# Patient Record
Sex: Male | Born: 1935 | Race: White | Hispanic: No | Marital: Married | Smoking: Never smoker
Health system: Southern US, Community
[De-identification: ages and names within clinical notes are randomized; demographics above are authoritative.]

## PROBLEM LIST (undated history)

## (undated) DIAGNOSIS — M47812 Spondylosis without myelopathy or radiculopathy, cervical region: Secondary | ICD-10-CM

## (undated) DIAGNOSIS — Z860100 Personal history of colon polyps, unspecified: Secondary | ICD-10-CM

## (undated) DIAGNOSIS — I2510 Atherosclerotic heart disease of native coronary artery without angina pectoris: Secondary | ICD-10-CM

## (undated) DIAGNOSIS — N400 Enlarged prostate without lower urinary tract symptoms: Secondary | ICD-10-CM

## (undated) DIAGNOSIS — K219 Gastro-esophageal reflux disease without esophagitis: Secondary | ICD-10-CM

## (undated) HISTORY — DX: Personal history of colon polyps, unspecified: Z86.0100

## (undated) HISTORY — DX: Spondylosis without myelopathy or radiculopathy, cervical region: M47.812

## (undated) HISTORY — DX: Gastro-esophageal reflux disease without esophagitis: K21.9

## (undated) HISTORY — DX: Personal history of colonic polyps: Z86.010

## (undated) HISTORY — DX: Benign prostatic hyperplasia without lower urinary tract symptoms: N40.0

## (undated) HISTORY — PX: ROTATOR CUFF REPAIR: SHX139

---

## 2002-10-22 ENCOUNTER — Observation Stay (HOSPITAL_COMMUNITY): Admission: RE | Admit: 2002-10-22 | Discharge: 2002-10-23 | Payer: Self-pay | Admitting: Orthopedic Surgery

## 2002-10-22 ENCOUNTER — Encounter: Payer: Self-pay | Admitting: Orthopedic Surgery

## 2008-08-11 ENCOUNTER — Ambulatory Visit: Payer: Self-pay | Admitting: Gastroenterology

## 2009-05-01 ENCOUNTER — Encounter: Admission: RE | Admit: 2009-05-01 | Discharge: 2009-05-01 | Payer: Self-pay | Admitting: Geriatric Medicine

## 2010-09-28 NOTE — Op Note (Signed)
NAME:  William Campbell, William Campbell NO.:  [id number]   MEDICAL RECORD NO.:  [id number]                   PATIENT TYPE:  AMB   LOCATION:  DAY                                  FACILITY:  [facility]   PHYSICIAN:  Vania Rea. Supple, M.D.               DATE OF BIRTH:  10-12-1929   DATE OF PROCEDURE:  10/22/2002  DATE OF DISCHARGE:                                 OPERATIVE REPORT   PREOPERATIVE DIAGNOSES:  1. Massive retracted right shoulder rotator cuff tear.  2. Right shoulder AC joint arthroses.   POSTOPERATIVE DIAGNOSES:  1. Massive retracted right shoulder rotator cuff tear.  2. Right shoulder AC joint arthroses.   PROCEDURE:  1. Open right shoulder rotator cuff repair.  2. Distal clavicle resection.  3. Restore patch augmentation of rotator cuff repair.   SURGEON:  Vania Rea. Supple, M.D.   Threasa HeadsFrench Ana A. Shuford, P.A.-C.   ANESTHESIA:  General endotracheal as well as Interscalene block.   ESTIMATED BLOOD LOSS:  Less than 100 cc.   HISTORY:  William Campbell is a 77 year old gentleman who fell, injuring his right  shoulder several months ago.  He has had persistent pain, weakness and  limitations in motion.  On physical examination showing marked weakness in  rotator cuff.  Preoperative MRI scan confirms severe retraction of a large  rotator cuff tear, with some evidence for fatty infiltration of the muscle  belly.   He is brought to the operating room for planned attempt at rotator cuff  repair.  Preoperatively the patient was counseled on treatment options, as  well as risks versus benefits thereof.  Possible surgical complications  include bleeding, infection, neurovascular injury, persistent pain, loss of  motion, recurrence of rotator cuff tear, and possible need for additional  surgery; these were all reviewed.  He understands and accepts and agrees  with our planned procedure.   PROCEDURE IN DETAIL:  After undergoing routine preoperative evaluation,  the  patient received prophylactic antibiotics.  Interscalene block was placed in  the holding area by the anesthesiologist.  He was placed supine on the  operating room table and underwent the smooth induction of general  endotracheal anesthesia.  He was placed in the beach chair position and  appropriately padded and protected.  The right shoulder girdle was sterilely  prepped and draped in standard fashion.   An incision was outlined, beginning at the [facility] joint and extending laterally  and distally for a length of approximately 7 cm.  Skin incisions were  infiltrated with 0.5% Marcaine with epinephrine.  The tendon was sharply  divided, as was the subcutaneous tissues.  The interval between the anterior  and mid third of the deltoid was then identified and split longitudinally  along the course of the deltoid fibers.  Electrocautery was used for  hemostasis.  Subperiosteal elevation was then used to expose the distal  clavicle and the  anterior acromion.  Oscillating saw was introduced and used  to perform distal clavicle resection.  The oscillating saw was then used to  perform a subacromial decompression, performing anterior and inferior  acromionectomy and creating a type 1 morphology.  Blunted bursal tissue was  excised circumferentially from the entire subacromial/subdeltoid bursa.  There was evidence for a very large retracted tear of the rotator cuff, with  the large portion of the rotator cuff having subluxed posteriorly.  The  humeral head was directly abutting the inner surface of the acromion.   The biceps tendon was inspected and was found to be intact; although there  was some evidence for tenosynovitis.  We mobilized the rotator cuff and  found that the posterior segment was actually of relatively good tissue  quality, and the muscle belly themselves had good elasticity.  A freed  margin of this posterior flap was then grasped with a #2 fiber wire suture.  It was then  mobilized in superior and inferior aspects, and we were able to  advance this to perform a side-to-side repair against the upper portion of  the subscapularis.  This was closed with figure-of-eight #2 fiber wire  sutures.   We freshened the freed margin of the rotator cuff with the #15 blade.  An  osteotome was then used to create a bony cancellous trough on the apex of  the greater tuberosity, and the width of the care site at the greater  tuberosity ended up being approximately 3 cm in width.  A series of grasping  sutures were then placed into the freed margin of the rotator cuff, adjacent  to the greater tuberosity.  Bone tunnels were then made, and sutures were  then passed; these were then tied over bony bridges, bringing the rotator  cuff margin down into good apposition against the cancellous bed on the  greater tuberosity.   At this point there was still some exposed area of greater tuberosity bony  bed, and to augment the repair we had decided to utilize a Restore patch.  This was appropriately hydrated and then placed to cover the entire repair  site, including the side-to-side repair as well as the repair back to the  greater tuberosity.  The Restore patch was placed with 2-0 Vicryl, and good  tension was achieved.   Final irrigation was performed.  Hemostasis was obtained.  The deltoid split  was then closed with interrupted figure-of-eight #1 Vicryl sutures.  Then 3-  0 Vicryl was used for the subcutaneous layer, and 3-0 Monocryl used for the  skin and followed by Steri-Strips.  Sterile dressing was placed over the  right shoulder.  The right arm was placed in a shoulder immobilizer.  The  patient was then extubated, placed supine on the exiting table, sent to  recovery room in stable condition.                                               Vania Rea. Supple, M.D.   KMS/MEDQ  D:  10/22/2002  T:  10/22/2002  Job:  [id number]

## 2011-04-26 ENCOUNTER — Other Ambulatory Visit: Payer: Self-pay | Admitting: Geriatric Medicine

## 2011-08-01 DIAGNOSIS — H2510 Age-related nuclear cataract, unspecified eye: Secondary | ICD-10-CM | POA: Diagnosis not present

## 2011-08-02 DIAGNOSIS — H2510 Age-related nuclear cataract, unspecified eye: Secondary | ICD-10-CM | POA: Diagnosis not present

## 2012-02-20 DIAGNOSIS — Z23 Encounter for immunization: Secondary | ICD-10-CM | POA: Diagnosis not present

## 2012-04-29 DIAGNOSIS — Z0000 Encounter for general adult medical examination without abnormal findings: Secondary | ICD-10-CM | POA: Diagnosis not present

## 2012-04-29 DIAGNOSIS — Z1331 Encounter for screening for depression: Secondary | ICD-10-CM | POA: Diagnosis not present

## 2012-04-29 DIAGNOSIS — Z136 Encounter for screening for cardiovascular disorders: Secondary | ICD-10-CM | POA: Diagnosis not present

## 2012-04-29 DIAGNOSIS — Z79899 Other long term (current) drug therapy: Secondary | ICD-10-CM | POA: Diagnosis not present

## 2012-08-13 DIAGNOSIS — H2510 Age-related nuclear cataract, unspecified eye: Secondary | ICD-10-CM | POA: Diagnosis not present

## 2012-08-13 DIAGNOSIS — R5381 Other malaise: Secondary | ICD-10-CM | POA: Diagnosis not present

## 2012-08-13 DIAGNOSIS — R5383 Other fatigue: Secondary | ICD-10-CM | POA: Diagnosis not present

## 2012-08-13 DIAGNOSIS — R05 Cough: Secondary | ICD-10-CM | POA: Diagnosis not present

## 2012-08-13 DIAGNOSIS — R634 Abnormal weight loss: Secondary | ICD-10-CM | POA: Diagnosis not present

## 2012-09-10 DIAGNOSIS — R05 Cough: Secondary | ICD-10-CM | POA: Diagnosis not present

## 2012-09-10 DIAGNOSIS — R634 Abnormal weight loss: Secondary | ICD-10-CM | POA: Diagnosis not present

## 2013-02-22 DIAGNOSIS — Z23 Encounter for immunization: Secondary | ICD-10-CM | POA: Diagnosis not present

## 2013-05-03 DIAGNOSIS — K219 Gastro-esophageal reflux disease without esophagitis: Secondary | ICD-10-CM | POA: Diagnosis not present

## 2013-05-03 DIAGNOSIS — R7309 Other abnormal glucose: Secondary | ICD-10-CM | POA: Diagnosis not present

## 2013-05-03 DIAGNOSIS — Z79899 Other long term (current) drug therapy: Secondary | ICD-10-CM | POA: Diagnosis not present

## 2013-05-03 DIAGNOSIS — Z0000 Encounter for general adult medical examination without abnormal findings: Secondary | ICD-10-CM | POA: Diagnosis not present

## 2013-05-03 DIAGNOSIS — E559 Vitamin D deficiency, unspecified: Secondary | ICD-10-CM | POA: Diagnosis not present

## 2013-05-03 DIAGNOSIS — Z1331 Encounter for screening for depression: Secondary | ICD-10-CM | POA: Diagnosis not present

## 2013-05-03 DIAGNOSIS — E7800 Pure hypercholesterolemia, unspecified: Secondary | ICD-10-CM | POA: Diagnosis not present

## 2013-08-02 DIAGNOSIS — E559 Vitamin D deficiency, unspecified: Secondary | ICD-10-CM | POA: Diagnosis not present

## 2013-09-21 DIAGNOSIS — H2510 Age-related nuclear cataract, unspecified eye: Secondary | ICD-10-CM | POA: Diagnosis not present

## 2013-10-01 DIAGNOSIS — Z961 Presence of intraocular lens: Secondary | ICD-10-CM | POA: Diagnosis not present

## 2013-10-01 DIAGNOSIS — H2510 Age-related nuclear cataract, unspecified eye: Secondary | ICD-10-CM | POA: Diagnosis not present

## 2013-10-01 DIAGNOSIS — H02839 Dermatochalasis of unspecified eye, unspecified eyelid: Secondary | ICD-10-CM | POA: Diagnosis not present

## 2013-10-01 DIAGNOSIS — H18419 Arcus senilis, unspecified eye: Secondary | ICD-10-CM | POA: Diagnosis not present

## 2013-11-29 DIAGNOSIS — H2510 Age-related nuclear cataract, unspecified eye: Secondary | ICD-10-CM | POA: Diagnosis not present

## 2013-11-29 DIAGNOSIS — H269 Unspecified cataract: Secondary | ICD-10-CM | POA: Diagnosis not present

## 2014-02-07 DIAGNOSIS — Z23 Encounter for immunization: Secondary | ICD-10-CM | POA: Diagnosis not present

## 2014-02-22 ENCOUNTER — Other Ambulatory Visit: Payer: Self-pay | Admitting: Geriatric Medicine

## 2014-02-22 ENCOUNTER — Ambulatory Visit
Admission: RE | Admit: 2014-02-22 | Discharge: 2014-02-22 | Disposition: A | Discharge status: Home / Self Care | Payer: Medicare Other | Source: Ambulatory Visit | Attending: Geriatric Medicine | Admitting: Geriatric Medicine

## 2014-02-22 DIAGNOSIS — R0609 Other forms of dyspnea: Secondary | ICD-10-CM | POA: Diagnosis not present

## 2014-02-22 DIAGNOSIS — R05 Cough: Secondary | ICD-10-CM

## 2014-02-22 DIAGNOSIS — R0989 Other specified symptoms and signs involving the circulatory and respiratory systems: Secondary | ICD-10-CM | POA: Diagnosis not present

## 2014-02-22 DIAGNOSIS — R059 Cough, unspecified: Secondary | ICD-10-CM

## 2014-02-22 DIAGNOSIS — R5383 Other fatigue: Secondary | ICD-10-CM | POA: Diagnosis not present

## 2014-03-02 ENCOUNTER — Encounter: Payer: Self-pay | Admitting: *Deleted

## 2014-03-08 ENCOUNTER — Ambulatory Visit: Payer: Medicare Other | Admitting: Cardiovascular Disease

## 2014-03-11 ENCOUNTER — Ambulatory Visit (INDEPENDENT_AMBULATORY_CARE_PROVIDER_SITE_OTHER): Payer: Medicare Other | Admitting: Cardiovascular Disease

## 2014-03-11 ENCOUNTER — Encounter: Payer: Self-pay | Admitting: Cardiovascular Disease

## 2014-03-11 VITALS — BP 165/100 | HR 67 | Ht 68.5 in | Wt 192.2 lb

## 2014-03-11 DIAGNOSIS — M5489 Other dorsalgia: Secondary | ICD-10-CM

## 2014-03-11 DIAGNOSIS — R5383 Other fatigue: Secondary | ICD-10-CM

## 2014-03-11 DIAGNOSIS — R030 Elevated blood-pressure reading, without diagnosis of hypertension: Secondary | ICD-10-CM | POA: Diagnosis not present

## 2014-03-11 DIAGNOSIS — R0602 Shortness of breath: Secondary | ICD-10-CM | POA: Diagnosis not present

## 2014-03-11 DIAGNOSIS — IMO0001 Reserved for inherently not codable concepts without codable children: Secondary | ICD-10-CM | POA: Insufficient documentation

## 2014-03-11 DIAGNOSIS — M549 Dorsalgia, unspecified: Secondary | ICD-10-CM | POA: Insufficient documentation

## 2014-03-11 NOTE — Assessment & Plan Note (Signed)
The patient has significant exertional dyspnea of unclear etiology. There is no evidence of fluid overload by physical exam. He had recent labs done which overall were unremarkable including normal CBC, basic metabolic profile and thyroid function. Chest x-ray showed no active cardiopulmonary disease. I agree that we have to rule out angina equivalent considering his age. Thus, I requested a pharmacologic nuclear stress test. Given that the predominant symptom is dyspnea, I also requested an echocardiogram.

## 2014-03-11 NOTE — Progress Notes (Signed)
Primary care physician: Dr. Pete GlatterStoneking  HPI  This is a pleasant 80 year old man who was referred for evaluation of fatigue and exertional dyspnea. He has no previous cardiac history. He has known history of borderline hyperlipidemia but otherwise no significant chronic medical conditions. He has been complaining of extreme exertional dyspnea and fatigue which has been getting worse over the last 6 months. He denies any chest discomfort. He has been active throughout his life. No orthopnea, PND or lower extremity edema. No palpitations, syncope or presyncope. He does not smoke. He drinks alcohol occasionally. He has no family history of coronary artery disease. He complains of chronic back and hip pain.  No Known Allergies   Current Outpatient Prescriptions on File Prior to Visit  Medication Sig Dispense Refill  . Cholecalciferol (VITAMIN D3) 2000 UNITS capsule Take 2,000 Units by mouth daily.      . Cyanocobalamin (VITAMIN B-12) 50 MCG LOZG Take by mouth 2 (two) times a week.      Marland Kitchen. omeprazole (PRILOSEC) 20 MG capsule Take 20 mg by mouth as needed.      . vitamin C (ASCORBIC ACID) 500 MG tablet Take 500 mg by mouth daily.       No current facility-administered medications on file prior to visit.     Past Medical History  Diagnosis Date  . GERD (gastroesophageal reflux disease)   . Osteoarthritis of neck   . BPH (benign prostatic hypertrophy)   . History of colonic polyps      Past Surgical History  Procedure Laterality Date  . Rotator cuff repair Right      Family History  Problem Relation Age of Onset  . Family history unknown: Yes     History   Social History  . Marital Status: Married    Spouse Name: N/A    Number of Children: N/A  . Years of Education: N/A   Occupational History  . Not on file.   Social History Main Topics  . Smoking status: Never Smoker   . Smokeless tobacco: Not on file  . Alcohol Use: Yes  . Drug Use: No  . Sexual Activity: Not on file    Other Topics Concern  . Not on file   Social History Narrative  . No narrative on file     ROS A 10 point review of system was performed. It is negative other than that mentioned in the history of present illness.   PHYSICAL EXAM   BP 165/100  Pulse 67  Ht 5' 8.5" (1.74 m)  Wt 192 lb 4 oz (87.204 kg)  BMI 28.80 kg/m2 Constitutional: He is oriented to person, place, and time. He appears well-developed and well-nourished. No distress.  HENT: No nasal discharge.  Head: Normocephalic and atraumatic.  Eyes: Pupils are equal and round.  No discharge. Neck: Normal range of motion. Neck supple. No JVD present. No thyromegaly present.  Cardiovascular: Normal rate, regular rhythm, normal heart sounds. Exam reveals no gallop and no friction rub. No murmur heard.  Pulmonary/Chest: Effort normal and breath sounds normal. No stridor. No respiratory distress. He has no wheezes. He has no rales. He exhibits no tenderness.  Abdominal: Soft. Bowel sounds are normal. He exhibits no distension. There is no tenderness. There is no rebound and no guarding.  Musculoskeletal: Normal range of motion. He exhibits no edema and no tenderness.  Neurological: He is alert and oriented to person, place, and time. Coordination normal.  Skin: Skin is warm and dry. No rash noted.  He is not diaphoretic. No erythema. No pallor.  Psychiatric: He has a normal mood and affect. His behavior is normal. Judgment and thought content normal.       HYQ:MVHQIEKG:Sinus  Rhythm  - occasional PAC    # PACs = 1. WITHIN NORMAL LIMITS   ASSESSMENT AND PLAN

## 2014-03-11 NOTE — Assessment & Plan Note (Signed)
Blood pressure is elevated today. However, this seems to be an unusual for him. Continue to monitor.

## 2014-03-11 NOTE — Assessment & Plan Note (Signed)
Femoral pulses are normal. Thus, this is likely not related to PAD.

## 2014-03-11 NOTE — Patient Instructions (Addendum)
[facility] MYOVIEW  Your caregiver has ordered a Stress Test with nuclear imaging. The purpose of this test is to evaluate the blood supply to your heart muscle. This procedure is referred to as a "Non-Invasive Stress Test." This is because other than having an IV started in your vein, nothing is inserted or "invades" your body. Cardiac stress tests are done to find areas of poor blood flow to the heart by determining the extent of coronary artery disease (CAD). Some patients exercise on a treadmill, which naturally increases the blood flow to your heart, while others who are  unable to walk on a treadmill due to physical limitations have a pharmacologic/chemical stress agent called Lexiscan . This medicine will mimic walking on a treadmill by temporarily increasing your coronary blood flow.   Please note: these test may take anywhere between 2-4 hours to complete  PLEASE REPORT TO [facility]  THE VOLUNTEERS AT THE FIRST DESK WILL DIRECT YOU WHERE TO GO  Date of Procedure:_________11/03/15____________________________  Arrival Time for Procedure:______0715 am________________________   PLEASE NOTIFY THE OFFICE AT LEAST 24 HOURS IN ADVANCE IF YOU ARE UNABLE TO KEEP YOUR APPOINTMENT.  [phone] AND  PLEASE NOTIFY NUC[facility]AR MEDICINE AT [facility] AT LEAST 24 HOURS IN ADVANCE IF YOU ARE UNABLE TO KEEP YOUR APPOINTMENT. [phone]  How to prepare for your Myoview test:  1. Do not eat or drink after midnight 2. No caffeine for 24 hours prior to test 3. No smoking 24 hours prior to test. 4. Your medication may be taken with water.  If your doctor stopped a medication because of this test, do not take that medication. 5. Ladies, please do not wear dresses.  Skirts or pants are appropriate. Please wear a short sleeve shirt. 6. No perfume, cologne or lotion. 7. Wear comfortable walking shoes. No heels!       Your physician has requested that you have an echocardiogram. Echocardiography is a  painless test that uses sound waves to create images of your heart. It provides your doctor with information about the size and shape of your heart and how well your heart's chambers and valves are working. This procedure takes approximately one hour. There are no restrictions for this procedure.  Your physician recommends that you schedule a follow-up appointment in:  As needed   Your next appointment will be scheduled in our new office located at :  [facility]  [address], [address]  [place], [place] [id number]

## 2014-03-13 HISTORY — PX: CARDIAC CATHETERIZATION: SHX172

## 2014-03-15 ENCOUNTER — Ambulatory Visit: Payer: Self-pay | Admitting: Cardiovascular Disease

## 2014-03-15 DIAGNOSIS — R0602 Shortness of breath: Secondary | ICD-10-CM | POA: Diagnosis not present

## 2014-03-16 ENCOUNTER — Other Ambulatory Visit: Payer: Self-pay | Admitting: *Deleted

## 2014-03-16 DIAGNOSIS — R0602 Shortness of breath: Secondary | ICD-10-CM

## 2014-03-18 ENCOUNTER — Telehealth: Payer: Self-pay | Admitting: *Deleted

## 2014-03-18 DIAGNOSIS — I5022 Chronic systolic (congestive) heart failure: Secondary | ICD-10-CM

## 2014-03-18 NOTE — Telephone Encounter (Signed)
Patient would like to speak with Dr. Kirke CorinArida before scheduling cath

## 2014-03-18 NOTE — Telephone Encounter (Signed)
I accidentally closed encounter  Please call patient

## 2014-03-18 NOTE — Telephone Encounter (Signed)
I don't know why the results did not come to my in basket.  The stress test was abnormal and showed possible blockage. I suggest proceeding with cardiac cath next week. I can do on Thursday.

## 2014-03-18 NOTE — Telephone Encounter (Signed)
Patient called wanting stress test results. 

## 2014-03-18 NOTE — Telephone Encounter (Signed)
Call patient Monday 03/21/14 to schedule cath per Dr. Kirke CorinArida

## 2014-03-21 DIAGNOSIS — Z23 Encounter for immunization: Secondary | ICD-10-CM | POA: Diagnosis not present

## 2014-03-21 MED ORDER — LOSARTAN POTASSIUM 25 MG PO TABS: 25.0000 mg | ORAL_TABLET | Freq: Every day | ORAL | Status: DC

## 2014-03-21 MED ORDER — CARVEDILOL 6.25 MG PO TABS: 6.2500 mg | ORAL_TABLET | Freq: Two times a day (BID) | ORAL | Status: DC

## 2014-03-21 NOTE — Telephone Encounter (Signed)
Informed Huntley [facility]Sara at [facility] that Coreg and Losartan were ordered in error  She verbalized understanding

## 2014-03-22 ENCOUNTER — Encounter: Payer: Self-pay | Admitting: Cardiovascular Disease

## 2014-03-22 ENCOUNTER — Ambulatory Visit (INDEPENDENT_AMBULATORY_CARE_PROVIDER_SITE_OTHER): Payer: Medicare Other | Admitting: Cardiovascular Disease

## 2014-03-22 VITALS — BP 148/98 | HR 67 | Ht 70.0 in | Wt 193.2 lb

## 2014-03-22 DIAGNOSIS — R9439 Abnormal result of other cardiovascular function study: Secondary | ICD-10-CM | POA: Diagnosis not present

## 2014-03-22 DIAGNOSIS — R030 Elevated blood-pressure reading, without diagnosis of hypertension: Secondary | ICD-10-CM | POA: Diagnosis not present

## 2014-03-22 DIAGNOSIS — Z01812 Encounter for preprocedural laboratory examination: Secondary | ICD-10-CM | POA: Diagnosis not present

## 2014-03-22 DIAGNOSIS — IMO0001 Reserved for inherently not codable concepts without codable children: Secondary | ICD-10-CM

## 2014-03-22 DIAGNOSIS — R0602 Shortness of breath: Secondary | ICD-10-CM

## 2014-03-22 MED ORDER — ASPIRIN 81 MG PO TABS: 81.0000 mg | ORAL_TABLET | Freq: Every day | ORAL | Status: DC

## 2014-03-22 NOTE — Telephone Encounter (Signed)
LVM at [facility] to schedule cath

## 2014-03-22 NOTE — Assessment & Plan Note (Signed)
His symptoms are likely angina equivalent. Symptoms are suggestive of class III angina. Nuclear stress test was abnormal at moderate risk. I discussed different management options and recommend proceeding with cardiac catheterization and possible coronary intervention. Risks and benefits were discussed with him extensively.

## 2014-03-22 NOTE — Telephone Encounter (Signed)
Patient in clinic today to review cath info

## 2014-03-22 NOTE — Assessment & Plan Note (Signed)
I would consider treatment with a beta blocker based on the results of cardiac catheterization.

## 2014-03-22 NOTE — Progress Notes (Signed)
Primary care physician: Dr. Pete GlatterStoneking  HPI  This is a pleasant 80 year old man who is here today for follow-up visit regarding fatigue and exertional dyspnea. He has no previous cardiac history. He has known history of borderline hyperlipidemia but otherwise no significant chronic medical conditions. He was seen recently for extreme exertional dyspnea and fatigue which has been getting worse over the last 6 months. He denies any chest discomfort. He has been active throughout his life. No orthopnea, PND or lower extremity edema. No palpitations, syncope or presyncope. He does not smoke. He drinks alcohol occasionally. He has no family history of coronary artery disease. He complains of chronic back and hip pain. He underwent a pharmacologic nuclear stress test which showed evidence of anterior and anterolateral ischemia with normal ejection fraction. Overall it was a moderate risk stress test.  No Known Allergies   Current Outpatient Prescriptions on File Prior to Visit  Medication Sig Dispense Refill  . Cholecalciferol (VITAMIN D3) 2000 UNITS capsule Take 2,000 Units by mouth daily.    . Cyanocobalamin (VITAMIN B-12) 50 MCG LOZG Take by mouth 2 (two) times a week.    Marland Kitchen. omeprazole (PRILOSEC) 20 MG capsule Take 20 mg by mouth as needed.    . vitamin C (ASCORBIC ACID) 500 MG tablet Take 500 mg by mouth daily.     No current facility-administered medications on file prior to visit.     Past Medical History  Diagnosis Date  . GERD (gastroesophageal reflux disease)   . Osteoarthritis of neck   . BPH (benign prostatic hypertrophy)   . History of colonic polyps      Past Surgical History  Procedure Laterality Date  . Rotator cuff repair Right      Family History  Problem Relation Age of Onset  . Family history unknown: Yes     History   Social History  . Marital Status: Married    Spouse Name: N/A    Number of Children: N/A  . Years of Education: N/A   Occupational  History  . Not on file.   Social History Main Topics  . Smoking status: Never Smoker   . Smokeless tobacco: Not on file  . Alcohol Use: Yes  . Drug Use: No  . Sexual Activity: Not on file   Other Topics Concern  . Not on file   Social History Narrative     ROS A 10 point review of system was performed. It is negative other than that mentioned in the history of present illness.   PHYSICAL EXAM   BP 148/98 mmHg  Pulse 67  Ht 5\' 10"  (1.778 m)  Wt 193 lb 4 oz (87.658 kg)  BMI 27.73 kg/m2 Constitutional: He is oriented to person, place, and time. He appears well-developed and well-nourished. No distress.  HENT: No nasal discharge.  Head: Normocephalic and atraumatic.  Eyes: Pupils are equal and round.  No discharge. Neck: Normal range of motion. Neck supple. No JVD present. No thyromegaly present.  Cardiovascular: Normal rate, regular rhythm, normal heart sounds. Exam reveals no gallop and no friction rub. No murmur heard.  Pulmonary/Chest: Effort normal and breath sounds normal. No stridor. No respiratory distress. He has no wheezes. He has no rales. He exhibits no tenderness.  Abdominal: Soft. Bowel sounds are normal. He exhibits no distension. There is no tenderness. There is no rebound and no guarding.  Musculoskeletal: Normal range of motion. He exhibits no edema and no tenderness.  Neurological: He is alert and oriented to  person, place, and time. Coordination normal.  Skin: Skin is warm and dry. No rash noted. He is not diaphoretic. No erythema. No pallor.  Psychiatric: He has a normal mood and affect. His behavior is normal. Judgment and thought content normal.       ASSESSMENT AND PLAN

## 2014-03-22 NOTE — Patient Instructions (Addendum)
[facility] Cardiac Cath Instructions   You are scheduled for a Cardiac Cath on:____11/13/15_____________________  Please arrive at __0630 am_____am on the day of your procedure  You will need to pre-register prior to the day of your procedure.  [facility].  Registration is the first desk on your right.  Please take the procedure order we have given you in order to be registered appropriately  Do not eat/drink anything after midnight  Someone will need to drive you home  It is recommended someone be with you for the first 24 hours after your procedure  Wear clothes that are easy to get on/off and wear slip on shoes if possible   Medications bring a current list of all medications with you  _x__ You may take all of your medications the morning of your procedure with enough water to swallow safely   Day of your procedure: Arrive at the Medical Mall entrance.  Free valet service is available.  After entering the Medical Mall please check-in at the registration desk (1st desk on your right) to receive your armband. After receiving your armband someone will escort you to the cardiac cath/special procedures waiting area.  The usual length of stay after your procedure is about 2 to 3 hours.  This can vary.  If you have any questions, please call our office at [phone], or you may call the cardiac cath lab at [facility] directly at [phone]  Your physician has recommended you make the following change in your medication:  Start Aspirin 81 mg once daily

## 2014-03-23 ENCOUNTER — Telehealth: Payer: Self-pay | Admitting: *Deleted

## 2014-03-23 LAB — CBC WITH DIFFERENTIAL
Basophils Absolute: 0 10*3/uL (ref 0.0–0.2)
Basos: 1 %
Eos: 5 %
Eosinophils Absolute: 0.3 10*3/uL (ref 0.0–0.4)
HCT: 42.4 % (ref 37.5–51.0)
Hemoglobin: 14.7 g/dL (ref 12.6–17.7)
IMMATURE GRANS (ABS): 0 10*3/uL (ref 0.0–0.1)
IMMATURE GRANULOCYTES: 0 %
Lymphocytes Absolute: 1.9 10*3/uL (ref 0.7–3.1)
Lymphs: 31 %
MCH: 31.8 pg (ref 26.6–33.0)
MCHC: 34.7 g/dL (ref 31.5–35.7)
MCV: 92 fL (ref 79–97)
MONOCYTES: 12 %
Monocytes Absolute: 0.7 10*3/uL (ref 0.1–0.9)
NEUTROS PCT: 51 %
Neutrophils Absolute: 3.1 10*3/uL (ref 1.4–7.0)
PLATELETS: 260 10*3/uL (ref 150–379)
RBC: 4.62 x10E6/uL (ref 4.14–5.80)
RDW: 13.5 % (ref 12.3–15.4)
WBC: 6.1 10*3/uL (ref 3.4–10.8)

## 2014-03-23 LAB — BASIC METABOLIC PANEL
BUN/Creatinine Ratio: 15 (ref 10–22)
BUN: 16 mg/dL (ref 8–27)
CHLORIDE: 98 mmol/L (ref 97–108)
CO2: 21 mmol/L (ref 18–29)
Calcium: 10.2 mg/dL (ref 8.6–10.2)
Creatinine, Ser: 1.1 mg/dL (ref 0.76–1.27)
GFR calc Af Amer: 72 mL/min/{1.73_m2} (ref >59)
GFR calc non Af Amer: 63 mL/min/{1.73_m2} (ref >59)
GLUCOSE: 125 mg/dL — AB (ref 65–99)
Potassium: 4.6 mmol/L (ref 3.5–5.2)
Sodium: 135 mmol/L (ref 134–144)

## 2014-03-23 LAB — PROTIME-INR
INR: 1 (ref 0.8–1.2)
PROTHROMBIN TIME: 10.6 s (ref 9.1–12.0)

## 2014-03-23 NOTE — Telephone Encounter (Signed)
Faxed cath orders confirmed by sharon

## 2014-03-25 ENCOUNTER — Ambulatory Visit: Payer: Self-pay | Admitting: Cardiovascular Disease

## 2014-03-25 DIAGNOSIS — I25118 Atherosclerotic heart disease of native coronary artery with other forms of angina pectoris: Secondary | ICD-10-CM

## 2014-03-25 DIAGNOSIS — Z79899 Other long term (current) drug therapy: Secondary | ICD-10-CM | POA: Diagnosis not present

## 2014-03-25 DIAGNOSIS — Z9889 Other specified postprocedural states: Secondary | ICD-10-CM | POA: Diagnosis not present

## 2014-03-25 DIAGNOSIS — I2510 Atherosclerotic heart disease of native coronary artery without angina pectoris: Secondary | ICD-10-CM | POA: Diagnosis not present

## 2014-03-25 DIAGNOSIS — I259 Chronic ischemic heart disease, unspecified: Secondary | ICD-10-CM | POA: Diagnosis not present

## 2014-03-25 DIAGNOSIS — K219 Gastro-esophageal reflux disease without esophagitis: Secondary | ICD-10-CM | POA: Diagnosis not present

## 2014-03-25 DIAGNOSIS — Z86010 Personal history of colonic polyps: Secondary | ICD-10-CM | POA: Diagnosis not present

## 2014-03-25 DIAGNOSIS — M1990 Unspecified osteoarthritis, unspecified site: Secondary | ICD-10-CM | POA: Diagnosis not present

## 2014-03-25 DIAGNOSIS — I10 Essential (primary) hypertension: Secondary | ICD-10-CM | POA: Diagnosis not present

## 2014-03-25 DIAGNOSIS — N400 Enlarged prostate without lower urinary tract symptoms: Secondary | ICD-10-CM | POA: Diagnosis not present

## 2014-03-29 DIAGNOSIS — R0609 Other forms of dyspnea: Secondary | ICD-10-CM | POA: Diagnosis not present

## 2014-03-31 ENCOUNTER — Ambulatory Visit: Payer: Self-pay | Admitting: Geriatric Medicine

## 2014-03-31 ENCOUNTER — Other Ambulatory Visit: Payer: Medicare Other

## 2014-03-31 DIAGNOSIS — R0600 Dyspnea, unspecified: Secondary | ICD-10-CM | POA: Diagnosis not present

## 2014-04-12 ENCOUNTER — Ambulatory Visit: Payer: Medicare Other | Admitting: Cardiovascular Disease

## 2014-04-14 ENCOUNTER — Encounter: Payer: Self-pay | Admitting: *Deleted

## 2014-04-15 ENCOUNTER — Ambulatory Visit (INDEPENDENT_AMBULATORY_CARE_PROVIDER_SITE_OTHER): Payer: Medicare Other | Admitting: Cardiovascular Disease

## 2014-04-15 ENCOUNTER — Encounter: Payer: Self-pay | Admitting: Cardiovascular Disease

## 2014-04-15 VITALS — BP 140/90 | HR 66

## 2014-04-15 DIAGNOSIS — R030 Elevated blood-pressure reading, without diagnosis of hypertension: Secondary | ICD-10-CM | POA: Diagnosis not present

## 2014-04-15 DIAGNOSIS — R0602 Shortness of breath: Secondary | ICD-10-CM

## 2014-04-15 DIAGNOSIS — IMO0001 Reserved for inherently not codable concepts without codable children: Secondary | ICD-10-CM

## 2014-04-15 NOTE — Assessment & Plan Note (Signed)
This does not seem to be cardiac based on results of cardiac cath. There is likely an element of physical deconditioning.  I advised him to follow up with PCP for further evaluation.

## 2014-04-15 NOTE — Assessment & Plan Note (Signed)
BP is borderline today.

## 2014-04-15 NOTE — Progress Notes (Signed)
Primary care physician: Dr. Pete GlatterStoneking  HPI  This is a pleasant [age] year old man who is here today for follow-up visit regarding fatigue and exertional dyspnea. He has no previous cardiac history. He has known history of borderline hyperlipidemia but otherwise no significant chronic medical conditions. He was seen recently for extreme exertional dyspnea and fatigue which has been getting worse over the last 6 months. He denies any chest discomfort. He has been active throughout his life. No orthopnea, PND or lower extremity edema. No palpitations, syncope or presyncope. He does not smoke. He drinks alcohol occasionally. He has no family history of coronary artery disease. He complains of chronic back and hip pain. He underwent a pharmacologic nuclear stress test which showed evidence of anterior and anterolateral ischemia with normal ejection fraction. Overall it was a moderate risk stress test.  I proceeded with cardiac cath via the right radial artery without showed mild 2 vessel CAD without evidence of obstructive disease, EF of 60% and mildly elevated LVEDP.   No Known Allergies   Current Outpatient Prescriptions on File Prior to Visit  Medication Sig Dispense Refill  . aspirin 81 MG tablet Take 1 tablet (81 mg total) by mouth daily. 30 tablet   . Cholecalciferol (VITAMIN D3) 2000 UNITS capsule Take 2,000 Units by mouth daily.    . Cyanocobalamin (VITAMIN B-12) 50 MCG LOZG Take by mouth 2 (two) times a week.    Marland Kitchen. omeprazole (PRILOSEC) 20 MG capsule Take 20 mg by mouth as needed.    . vitamin C (ASCORBIC ACID) 500 MG tablet Take 500 mg by mouth daily.     No current facility-administered medications on file prior to visit.     Past Medical History  Diagnosis Date  . GERD (gastroesophageal reflux disease)   . Osteoarthritis of neck   . BPH (benign prostatic hypertrophy)   . History of colonic polyps      Past Surgical History  Procedure Laterality Date  . Rotator cuff repair Right    . Cardiac catheterization  03/2014    [facility]. mild 2 vessel CAD. LAD: 30% mid, RCA: 20% ostial.      Family History  Problem Relation Age of Onset  . Family history unknown: Yes     History   Social History  . Marital Status: Married    Spouse Name: N/A    Number of Children: N/A  . Years of Education: N/A   Occupational History  . Not on file.   Social History Main Topics  . Smoking status: Never Smoker   . Smokeless tobacco: Not on file  . Alcohol Use: Yes  . Drug Use: No  . Sexual Activity: Not on file   Other Topics Concern  . Not on file   Social History Narrative     ROS A 10 point review of system was performed. It is negative other than that mentioned in the history of present illness.   PHYSICAL EXAM   BP 140/90 mmHg  Pulse 66 Constitutional: He is oriented to person, place, and time. He appears well-developed and well-nourished. No distress.  HENT: No nasal discharge.  Head: Normocephalic and atraumatic.  Eyes: Pupils are equal and round.  No discharge. Neck: Normal range of motion. Neck supple. No JVD present. No thyromegaly present.  Cardiovascular: Normal rate, regular rhythm, normal heart sounds. Exam reveals no gallop and no friction rub. No murmur heard.  Pulmonary/Chest: Effort normal and breath sounds normal. No stridor. No respiratory distress. He has no wheezes.  He has no rales. He exhibits no tenderness.  Abdominal: Soft. Bowel sounds are normal. He exhibits no distension. There is no tenderness. There is no rebound and no guarding.  Musculoskeletal: Normal range of motion. He exhibits no edema and no tenderness.  Neurological: He is alert and oriented to person, place, and time. Coordination normal.  Skin: Skin is warm and dry. No rash noted. He is not diaphoretic. No erythema. No pallor.  Psychiatric: He has a normal mood and affect. His behavior is normal. Judgment and thought content normal.    EKG: NSR.    ASSESSMENT AND  PLAN

## 2014-04-15 NOTE — Patient Instructions (Signed)
Your physician recommends that you schedule a follow-up appointment in:  As needed with Dr. Kirke CorinArida

## 2014-04-28 ENCOUNTER — Encounter: Payer: Self-pay | Admitting: Cardiovascular Disease

## 2014-05-09 DIAGNOSIS — Z1389 Encounter for screening for other disorder: Secondary | ICD-10-CM | POA: Diagnosis not present

## 2014-05-09 DIAGNOSIS — R5383 Other fatigue: Secondary | ICD-10-CM | POA: Diagnosis not present

## 2014-05-09 DIAGNOSIS — Z0000 Encounter for general adult medical examination without abnormal findings: Secondary | ICD-10-CM | POA: Diagnosis not present

## 2014-05-09 DIAGNOSIS — R05 Cough: Secondary | ICD-10-CM | POA: Diagnosis not present

## 2014-06-23 DIAGNOSIS — M4306 Spondylolysis, lumbar region: Secondary | ICD-10-CM | POA: Diagnosis not present

## 2014-06-23 DIAGNOSIS — M545 Low back pain: Secondary | ICD-10-CM | POA: Diagnosis not present

## 2014-07-04 ENCOUNTER — Ambulatory Visit: Payer: Self-pay | Admitting: Physical Medicine and Rehabilitation

## 2014-07-04 DIAGNOSIS — M4316 Spondylolisthesis, lumbar region: Secondary | ICD-10-CM | POA: Diagnosis not present

## 2014-07-07 DIAGNOSIS — M4306 Spondylolysis, lumbar region: Secondary | ICD-10-CM | POA: Diagnosis not present

## 2014-07-07 DIAGNOSIS — M545 Low back pain: Secondary | ICD-10-CM | POA: Diagnosis not present

## 2014-07-25 DIAGNOSIS — M545 Low back pain: Secondary | ICD-10-CM | POA: Diagnosis not present

## 2014-07-25 DIAGNOSIS — M4316 Spondylolisthesis, lumbar region: Secondary | ICD-10-CM | POA: Diagnosis not present

## 2014-07-25 DIAGNOSIS — M6281 Muscle weakness (generalized): Secondary | ICD-10-CM | POA: Diagnosis not present

## 2014-07-27 DIAGNOSIS — M6281 Muscle weakness (generalized): Secondary | ICD-10-CM | POA: Diagnosis not present

## 2014-07-27 DIAGNOSIS — M4316 Spondylolisthesis, lumbar region: Secondary | ICD-10-CM | POA: Diagnosis not present

## 2014-07-27 DIAGNOSIS — M545 Low back pain: Secondary | ICD-10-CM | POA: Diagnosis not present

## 2014-08-01 DIAGNOSIS — M4316 Spondylolisthesis, lumbar region: Secondary | ICD-10-CM | POA: Diagnosis not present

## 2014-08-01 DIAGNOSIS — M545 Low back pain: Secondary | ICD-10-CM | POA: Diagnosis not present

## 2014-08-01 DIAGNOSIS — M6281 Muscle weakness (generalized): Secondary | ICD-10-CM | POA: Diagnosis not present

## 2014-08-04 DIAGNOSIS — M545 Low back pain: Secondary | ICD-10-CM | POA: Diagnosis not present

## 2014-08-04 DIAGNOSIS — M6281 Muscle weakness (generalized): Secondary | ICD-10-CM | POA: Diagnosis not present

## 2014-08-04 DIAGNOSIS — M4316 Spondylolisthesis, lumbar region: Secondary | ICD-10-CM | POA: Diagnosis not present

## 2014-08-08 DIAGNOSIS — M4316 Spondylolisthesis, lumbar region: Secondary | ICD-10-CM | POA: Diagnosis not present

## 2014-08-08 DIAGNOSIS — M6281 Muscle weakness (generalized): Secondary | ICD-10-CM | POA: Diagnosis not present

## 2014-08-08 DIAGNOSIS — M545 Low back pain: Secondary | ICD-10-CM | POA: Diagnosis not present

## 2014-08-11 DIAGNOSIS — M4316 Spondylolisthesis, lumbar region: Secondary | ICD-10-CM | POA: Diagnosis not present

## 2014-08-11 DIAGNOSIS — M545 Low back pain: Secondary | ICD-10-CM | POA: Diagnosis not present

## 2014-08-11 DIAGNOSIS — M6281 Muscle weakness (generalized): Secondary | ICD-10-CM | POA: Diagnosis not present

## 2014-08-15 DIAGNOSIS — M545 Low back pain: Secondary | ICD-10-CM | POA: Diagnosis not present

## 2014-08-15 DIAGNOSIS — M6281 Muscle weakness (generalized): Secondary | ICD-10-CM | POA: Diagnosis not present

## 2014-08-15 DIAGNOSIS — M4316 Spondylolisthesis, lumbar region: Secondary | ICD-10-CM | POA: Diagnosis not present

## 2014-08-18 DIAGNOSIS — M545 Low back pain: Secondary | ICD-10-CM | POA: Diagnosis not present

## 2014-08-18 DIAGNOSIS — M6281 Muscle weakness (generalized): Secondary | ICD-10-CM | POA: Diagnosis not present

## 2014-08-18 DIAGNOSIS — M4316 Spondylolisthesis, lumbar region: Secondary | ICD-10-CM | POA: Diagnosis not present

## 2014-12-22 DIAGNOSIS — H3531 Nonexudative age-related macular degeneration: Secondary | ICD-10-CM | POA: Diagnosis not present

## 2015-01-23 DIAGNOSIS — M1711 Unilateral primary osteoarthritis, right knee: Secondary | ICD-10-CM | POA: Diagnosis not present

## 2015-03-22 DIAGNOSIS — Z23 Encounter for immunization: Secondary | ICD-10-CM | POA: Diagnosis not present

## 2015-05-16 DIAGNOSIS — Z1389 Encounter for screening for other disorder: Secondary | ICD-10-CM | POA: Diagnosis not present

## 2015-05-16 DIAGNOSIS — Z0000 Encounter for general adult medical examination without abnormal findings: Secondary | ICD-10-CM | POA: Diagnosis not present

## 2015-05-16 DIAGNOSIS — K219 Gastro-esophageal reflux disease without esophagitis: Secondary | ICD-10-CM | POA: Diagnosis not present

## 2015-05-16 DIAGNOSIS — Z23 Encounter for immunization: Secondary | ICD-10-CM | POA: Diagnosis not present

## 2015-05-16 DIAGNOSIS — E7800 Pure hypercholesterolemia, unspecified: Secondary | ICD-10-CM | POA: Diagnosis not present

## 2015-05-16 DIAGNOSIS — R5383 Other fatigue: Secondary | ICD-10-CM | POA: Diagnosis not present

## 2016-01-11 DIAGNOSIS — H353131 Nonexudative age-related macular degeneration, bilateral, early dry stage: Secondary | ICD-10-CM | POA: Diagnosis not present

## 2016-02-29 DIAGNOSIS — Z23 Encounter for immunization: Secondary | ICD-10-CM | POA: Diagnosis not present

## 2016-04-18 DIAGNOSIS — H353131 Nonexudative age-related macular degeneration, bilateral, early dry stage: Secondary | ICD-10-CM | POA: Diagnosis not present

## 2016-06-12 DIAGNOSIS — J449 Chronic obstructive pulmonary disease, unspecified: Secondary | ICD-10-CM | POA: Diagnosis not present

## 2016-06-12 DIAGNOSIS — Z1389 Encounter for screening for other disorder: Secondary | ICD-10-CM | POA: Diagnosis not present

## 2016-06-12 DIAGNOSIS — K219 Gastro-esophageal reflux disease without esophagitis: Secondary | ICD-10-CM | POA: Diagnosis not present

## 2016-06-12 DIAGNOSIS — Z0000 Encounter for general adult medical examination without abnormal findings: Secondary | ICD-10-CM | POA: Diagnosis not present

## 2016-06-12 DIAGNOSIS — M25569 Pain in unspecified knee: Secondary | ICD-10-CM | POA: Diagnosis not present

## 2016-06-12 DIAGNOSIS — G629 Polyneuropathy, unspecified: Secondary | ICD-10-CM | POA: Diagnosis not present

## 2016-06-12 DIAGNOSIS — Z23 Encounter for immunization: Secondary | ICD-10-CM | POA: Diagnosis not present

## 2017-03-06 DIAGNOSIS — H353131 Nonexudative age-related macular degeneration, bilateral, early dry stage: Secondary | ICD-10-CM | POA: Diagnosis not present

## 2017-03-10 DIAGNOSIS — Z23 Encounter for immunization: Secondary | ICD-10-CM | POA: Diagnosis not present

## 2017-06-19 DIAGNOSIS — Z0000 Encounter for general adult medical examination without abnormal findings: Secondary | ICD-10-CM | POA: Diagnosis not present

## 2017-06-19 DIAGNOSIS — G629 Polyneuropathy, unspecified: Secondary | ICD-10-CM | POA: Diagnosis not present

## 2017-06-19 DIAGNOSIS — K219 Gastro-esophageal reflux disease without esophagitis: Secondary | ICD-10-CM | POA: Diagnosis not present

## 2017-06-19 DIAGNOSIS — Z1389 Encounter for screening for other disorder: Secondary | ICD-10-CM | POA: Diagnosis not present

## 2017-06-19 DIAGNOSIS — J449 Chronic obstructive pulmonary disease, unspecified: Secondary | ICD-10-CM | POA: Diagnosis not present

## 2017-07-07 DIAGNOSIS — G5603 Carpal tunnel syndrome, bilateral upper limbs: Secondary | ICD-10-CM | POA: Diagnosis not present

## 2017-07-09 DIAGNOSIS — G629 Polyneuropathy, unspecified: Secondary | ICD-10-CM | POA: Diagnosis not present

## 2017-08-08 DIAGNOSIS — N401 Enlarged prostate with lower urinary tract symptoms: Secondary | ICD-10-CM | POA: Diagnosis not present

## 2017-08-08 DIAGNOSIS — R5383 Other fatigue: Secondary | ICD-10-CM | POA: Diagnosis not present

## 2017-08-08 DIAGNOSIS — R351 Nocturia: Secondary | ICD-10-CM | POA: Diagnosis not present

## 2017-08-08 DIAGNOSIS — J449 Chronic obstructive pulmonary disease, unspecified: Secondary | ICD-10-CM | POA: Diagnosis not present

## 2017-08-08 DIAGNOSIS — R0602 Shortness of breath: Secondary | ICD-10-CM | POA: Diagnosis not present

## 2017-08-08 DIAGNOSIS — Z125 Encounter for screening for malignant neoplasm of prostate: Secondary | ICD-10-CM | POA: Diagnosis not present

## 2017-08-08 DIAGNOSIS — R5381 Other malaise: Secondary | ICD-10-CM | POA: Diagnosis not present

## 2017-08-11 DIAGNOSIS — R5381 Other malaise: Secondary | ICD-10-CM | POA: Diagnosis not present

## 2017-08-11 DIAGNOSIS — N401 Enlarged prostate with lower urinary tract symptoms: Secondary | ICD-10-CM | POA: Diagnosis not present

## 2017-08-11 DIAGNOSIS — R351 Nocturia: Secondary | ICD-10-CM | POA: Diagnosis not present

## 2017-08-11 DIAGNOSIS — R5383 Other fatigue: Secondary | ICD-10-CM | POA: Diagnosis not present

## 2017-09-03 ENCOUNTER — Encounter: Payer: Self-pay | Admitting: Urology

## 2017-09-03 ENCOUNTER — Ambulatory Visit (INDEPENDENT_AMBULATORY_CARE_PROVIDER_SITE_OTHER): Payer: Medicare Other | Admitting: Urology

## 2017-09-03 VITALS — BP 151/86 | HR 74 | Ht 70.0 in | Wt 181.0 lb

## 2017-09-03 DIAGNOSIS — R972 Elevated prostate specific antigen [PSA]: Secondary | ICD-10-CM | POA: Diagnosis not present

## 2017-09-03 NOTE — Progress Notes (Signed)
09/03/2017 1:15 PM   William Campbell 04/10/1930 [id number]  Referring provider: Merlene LaughterStoneking, Hal, MD [address] [place], [place] [address]  Chief Complaint  Patient presents with  . Elevated PSA    HPI: William RoyaltyRobert Campbell is an 84 year old male referred for evaluation of an elevated PSA.  He had a recent visit with Dr. Meredeth IdeFleming and not related to nocturia.  A PSA was drawn which was elevated at 8.81.  He states his last PSA was when he turns 77 and it was elected to discontinue PSA screening.  He had a history of an elevated PSA at one-point which apparently returned to normal after antibiotic treatment.  I do not see any previous PSA results.  He denies bothersome lower urinary tract symptoms.  He has nocturia x1-2.  Denies dysuria or gross hematuria.  He denies flank, abdominal, pelvic or scrotal pain.   PMH: Past Medical History:  Diagnosis Date  . BPH (benign prostatic hypertrophy)   . GERD (gastroesophageal reflux disease)   . History of colonic polyps   . Osteoarthritis of neck     Surgical History: Past Surgical History:  Procedure Laterality Date  . CARDIAC CATHETERIZATION  03/2014   [facility]. mild 2 vessel CAD. LAD: 30% mid, RCA: 20% ostial.   . ROTATOR CUFF REPAIR Right     Home Medications:  Allergies as of 09/03/2017   No Known Allergies     Medication List        Accurate as of 09/03/17  1:15 PM. Always use your most recent med list.          albuterol 108 (90 Base) MCG/ACT inhaler Commonly known as:  PROVENTIL HFA;VENTOLIN HFA INL 2 INHALATIONS ITL Q 6 H PRF WHZ   aspirin 81 MG tablet Take 1 tablet (81 mg total) by mouth daily.   omeprazole 20 MG capsule Commonly known as:  PRILOSEC Take 20 mg by mouth as needed.   Vitamin B-12 50 MCG Lozg Take by mouth 2 (two) times a week.   vitamin C 500 MG tablet Commonly known as:  ASCORBIC ACID Take 500 mg by mouth daily.   Vitamin D3 2000 units capsule Take 2,000 Units by mouth daily.       Allergies: No Known Allergies  Family History: Family History  Problem Relation Age of Onset  . Prostate cancer Neg Hx   . Kidney cancer Neg Hx   . Bladder Cancer Neg Hx     Social History:  reports that he has never smoked. He has never used smokeless tobacco. He reports that he drinks alcohol. He reports that he does not use drugs.  ROS: UROLOGY Frequent Urination?: No Hard to postpone urination?: No Burning/pain with urination?: No Get up at night to urinate?: Yes Leakage of urine?: Yes Urine stream starts and stops?: No Trouble starting stream?: No Do you have to strain to urinate?: No Blood in urine?: No Urinary tract infection?: No Sexually transmitted disease?: No Injury to kidneys or bladder?: No Painful intercourse?: No Weak stream?: No Erection problems?: No Penile pain?: No  Gastrointestinal Nausea?: No Vomiting?: No Indigestion/heartburn?: Yes Diarrhea?: No Constipation?: No  Constitutional Fever: No Night sweats?: No Weight loss?: No Fatigue?: Yes  Skin Skin rash/lesions?: No Itching?: No  Eyes Blurred vision?: No Double vision?: No  Ears/Nose/Throat Sore throat?: No Sinus problems?: No  Hematologic/Lymphatic Swollen glands?: No Easy bruising?: No  Cardiovascular Leg swelling?: No Chest pain?: No  Respiratory Cough?: Yes Shortness of breath?: Yes  Endocrine Excessive thirst?: No  Musculoskeletal Back pain?: Yes Joint pain?: Yes  Neurological Headaches?: No Dizziness?: No  Psychologic Depression?: No Anxiety?: No  Physical Exam: BP (!) 151/86 (BP Location: Left Arm, Patient Position: Sitting, Cuff Size: Normal)   Pulse 74   Ht 5\' 10"  (1.778 m)   Wt 181 lb (82.1 kg)   BMI 25.97 kg/m   Constitutional:  Alert and oriented, No acute distress. HEENT: [place] AT, moist mucus membranes.  Trachea midline, no masses. Cardiovascular: No clubbing, cyanosis, or edema. Respiratory: Normal respiratory effort, no increased  work of breathing. GI: Abdomen is soft, nontender, nondistended, no abdominal masses GU: No CVA tenderness.  Prostate 60+ cc, smooth without nodules Lymph: No cervical or inguinal lymphadenopathy. Skin: No rashes, bruises or suspicious lesions. Neurologic: Grossly intact, no focal deficits, moving all 4 extremities. Psychiatric: Normal mood and affect.  Laboratory Data: Lab Results  Component Value Date   WBC 6.1 03/22/2014   HGB 14.7 03/22/2014   HCT 42.4 03/22/2014   MCV 92 03/22/2014   PLT 260 03/22/2014    Lab Results  Component Value Date   CREATININE 1.10 03/22/2014     Assessment & Plan:   84 year old male with a mild PSA elevation.  He has a benign DRE.  I discussed the American Urological Association recommendations for PSA screening and that screening is not recommended after age 70-75.    He was also informed that approximately 50% of men in their 68s will have  low-grade prostate cancer.  I discussed management options of surveillance versus prostate biopsy.  He does not desire biopsy which is certainly reasonable based on his age.    Since his PSA has been checked and is elevated would recommend a follow-up PSA in 6 months.  If it is stable at that time would no longer check.    Return in about 6 months (around 03/05/2018) for lab visit , PSA.  Riki Altes, MD  [facility] [address] [place], [place] [address] [phone]

## 2017-09-08 DIAGNOSIS — H353131 Nonexudative age-related macular degeneration, bilateral, early dry stage: Secondary | ICD-10-CM | POA: Diagnosis not present

## 2017-10-23 DIAGNOSIS — R0602 Shortness of breath: Secondary | ICD-10-CM | POA: Diagnosis not present

## 2017-11-27 DIAGNOSIS — R0602 Shortness of breath: Secondary | ICD-10-CM | POA: Diagnosis not present

## 2018-02-23 DIAGNOSIS — Z23 Encounter for immunization: Secondary | ICD-10-CM | POA: Diagnosis not present

## 2018-02-27 ENCOUNTER — Other Ambulatory Visit: Payer: Self-pay | Admitting: Family Medicine

## 2018-02-27 DIAGNOSIS — R972 Elevated prostate specific antigen [PSA]: Secondary | ICD-10-CM

## 2018-03-03 ENCOUNTER — Other Ambulatory Visit: Payer: Medicare Other

## 2018-03-03 ENCOUNTER — Other Ambulatory Visit
Admission: RE | Admit: 2018-03-03 | Discharge: 2018-03-03 | Disposition: A | Discharge status: Home / Self Care | Payer: Medicare Other | Source: Ambulatory Visit | Attending: Urology | Admitting: Urology

## 2018-03-03 DIAGNOSIS — R972 Elevated prostate specific antigen [PSA]: Secondary | ICD-10-CM | POA: Diagnosis not present

## 2018-03-03 LAB — PSA: Prostatic Specific Antigen: 6.22 ng/mL — ABNORMAL HIGH (ref 0.00–4.00)

## 2018-03-03 NOTE — Addendum Note (Signed)
Addended by: Deloria Lair on: 03/03/2018 09:44 AM   Modules accepted: Orders

## 2018-03-05 ENCOUNTER — Telehealth: Payer: Self-pay

## 2018-03-05 DIAGNOSIS — R0602 Shortness of breath: Secondary | ICD-10-CM | POA: Diagnosis not present

## 2018-03-05 DIAGNOSIS — J449 Chronic obstructive pulmonary disease, unspecified: Secondary | ICD-10-CM | POA: Diagnosis not present

## 2018-03-05 NOTE — Telephone Encounter (Signed)
-----   Message from Riki Altes, MD sent at 03/05/2018 10:39 AM EDT ----- Repeat PSA much lower at 6.22.  Based on his age we discussed discontinuing further PSA screening.

## 2018-03-10 DIAGNOSIS — H353131 Nonexudative age-related macular degeneration, bilateral, early dry stage: Secondary | ICD-10-CM | POA: Diagnosis not present

## 2018-05-18 DIAGNOSIS — H26492 Other secondary cataract, left eye: Secondary | ICD-10-CM | POA: Diagnosis not present

## 2018-06-01 DIAGNOSIS — H26492 Other secondary cataract, left eye: Secondary | ICD-10-CM | POA: Diagnosis not present

## 2018-06-24 DIAGNOSIS — G629 Polyneuropathy, unspecified: Secondary | ICD-10-CM | POA: Diagnosis not present

## 2018-06-24 DIAGNOSIS — Z1389 Encounter for screening for other disorder: Secondary | ICD-10-CM | POA: Diagnosis not present

## 2018-06-24 DIAGNOSIS — J449 Chronic obstructive pulmonary disease, unspecified: Secondary | ICD-10-CM | POA: Diagnosis not present

## 2018-06-24 DIAGNOSIS — R252 Cramp and spasm: Secondary | ICD-10-CM | POA: Diagnosis not present

## 2018-06-24 DIAGNOSIS — Z0000 Encounter for general adult medical examination without abnormal findings: Secondary | ICD-10-CM | POA: Diagnosis not present

## 2018-06-29 DIAGNOSIS — H26491 Other secondary cataract, right eye: Secondary | ICD-10-CM | POA: Diagnosis not present

## 2018-11-05 DIAGNOSIS — J449 Chronic obstructive pulmonary disease, unspecified: Secondary | ICD-10-CM | POA: Diagnosis not present

## 2018-12-03 DIAGNOSIS — H353131 Nonexudative age-related macular degeneration, bilateral, early dry stage: Secondary | ICD-10-CM | POA: Diagnosis not present

## 2019-02-03 DIAGNOSIS — Z23 Encounter for immunization: Secondary | ICD-10-CM | POA: Diagnosis not present

## 2019-07-07 DIAGNOSIS — M25642 Stiffness of left hand, not elsewhere classified: Secondary | ICD-10-CM | POA: Diagnosis not present

## 2019-07-07 DIAGNOSIS — M25511 Pain in right shoulder: Secondary | ICD-10-CM | POA: Diagnosis not present

## 2019-07-07 DIAGNOSIS — J449 Chronic obstructive pulmonary disease, unspecified: Secondary | ICD-10-CM | POA: Diagnosis not present

## 2019-07-07 DIAGNOSIS — Z79899 Other long term (current) drug therapy: Secondary | ICD-10-CM | POA: Diagnosis not present

## 2019-07-07 DIAGNOSIS — G629 Polyneuropathy, unspecified: Secondary | ICD-10-CM | POA: Diagnosis not present

## 2019-07-07 DIAGNOSIS — Z1389 Encounter for screening for other disorder: Secondary | ICD-10-CM | POA: Diagnosis not present

## 2019-07-07 DIAGNOSIS — R2689 Other abnormalities of gait and mobility: Secondary | ICD-10-CM | POA: Diagnosis not present

## 2019-07-07 DIAGNOSIS — Z0000 Encounter for general adult medical examination without abnormal findings: Secondary | ICD-10-CM | POA: Diagnosis not present

## 2019-07-07 DIAGNOSIS — M542 Cervicalgia: Secondary | ICD-10-CM | POA: Diagnosis not present

## 2020-01-05 DIAGNOSIS — Z23 Encounter for immunization: Secondary | ICD-10-CM | POA: Diagnosis not present

## 2020-01-25 DIAGNOSIS — Z23 Encounter for immunization: Secondary | ICD-10-CM | POA: Diagnosis not present

## 2020-06-14 DIAGNOSIS — J449 Chronic obstructive pulmonary disease, unspecified: Secondary | ICD-10-CM | POA: Diagnosis not present

## 2020-06-14 DIAGNOSIS — R0600 Dyspnea, unspecified: Secondary | ICD-10-CM | POA: Diagnosis not present

## 2020-07-13 DIAGNOSIS — Z0000 Encounter for general adult medical examination without abnormal findings: Secondary | ICD-10-CM | POA: Diagnosis not present

## 2020-07-13 DIAGNOSIS — G629 Polyneuropathy, unspecified: Secondary | ICD-10-CM | POA: Diagnosis not present

## 2020-07-13 DIAGNOSIS — Z79899 Other long term (current) drug therapy: Secondary | ICD-10-CM | POA: Diagnosis not present

## 2020-07-13 DIAGNOSIS — Z1389 Encounter for screening for other disorder: Secondary | ICD-10-CM | POA: Diagnosis not present

## 2020-07-13 DIAGNOSIS — K219 Gastro-esophageal reflux disease without esophagitis: Secondary | ICD-10-CM | POA: Diagnosis not present

## 2020-07-13 DIAGNOSIS — J449 Chronic obstructive pulmonary disease, unspecified: Secondary | ICD-10-CM | POA: Diagnosis not present

## 2021-01-11 ENCOUNTER — Emergency Department
Admission: EM | Admit: 2021-01-11 | Discharge: 2021-01-11 | Disposition: A | Discharge status: Home / Self Care | Payer: Medicare Other | Attending: Emergency Medicine | Admitting: Emergency Medicine

## 2021-01-11 ENCOUNTER — Emergency Department: Payer: Medicare Other

## 2021-01-11 ENCOUNTER — Other Ambulatory Visit: Payer: Self-pay

## 2021-01-11 DIAGNOSIS — Z79899 Other long term (current) drug therapy: Secondary | ICD-10-CM | POA: Diagnosis not present

## 2021-01-11 DIAGNOSIS — R509 Fever, unspecified: Secondary | ICD-10-CM | POA: Diagnosis not present

## 2021-01-11 DIAGNOSIS — W19XXXA Unspecified fall, initial encounter: Secondary | ICD-10-CM | POA: Diagnosis not present

## 2021-01-11 DIAGNOSIS — Z7982 Long term (current) use of aspirin: Secondary | ICD-10-CM | POA: Diagnosis not present

## 2021-01-11 DIAGNOSIS — I651 Occlusion and stenosis of basilar artery: Secondary | ICD-10-CM | POA: Diagnosis not present

## 2021-01-11 DIAGNOSIS — I10 Essential (primary) hypertension: Secondary | ICD-10-CM | POA: Insufficient documentation

## 2021-01-11 DIAGNOSIS — Z955 Presence of coronary angioplasty implant and graft: Secondary | ICD-10-CM | POA: Diagnosis not present

## 2021-01-11 DIAGNOSIS — U071 COVID-19: Secondary | ICD-10-CM | POA: Insufficient documentation

## 2021-01-11 DIAGNOSIS — I6501 Occlusion and stenosis of right vertebral artery: Secondary | ICD-10-CM | POA: Diagnosis not present

## 2021-01-11 DIAGNOSIS — M47812 Spondylosis without myelopathy or radiculopathy, cervical region: Secondary | ICD-10-CM | POA: Diagnosis not present

## 2021-01-11 DIAGNOSIS — I2510 Atherosclerotic heart disease of native coronary artery without angina pectoris: Secondary | ICD-10-CM | POA: Insufficient documentation

## 2021-01-11 DIAGNOSIS — R102 Pelvic and perineal pain: Secondary | ICD-10-CM | POA: Diagnosis not present

## 2021-01-11 DIAGNOSIS — R531 Weakness: Secondary | ICD-10-CM | POA: Diagnosis not present

## 2021-01-11 DIAGNOSIS — R0902 Hypoxemia: Secondary | ICD-10-CM | POA: Diagnosis not present

## 2021-01-11 DIAGNOSIS — R059 Cough, unspecified: Secondary | ICD-10-CM | POA: Diagnosis not present

## 2021-01-11 DIAGNOSIS — R42 Dizziness and giddiness: Secondary | ICD-10-CM | POA: Diagnosis not present

## 2021-01-11 DIAGNOSIS — J3489 Other specified disorders of nose and nasal sinuses: Secondary | ICD-10-CM | POA: Diagnosis not present

## 2021-01-11 HISTORY — DX: Atherosclerotic heart disease of native coronary artery without angina pectoris: I25.10

## 2021-01-11 LAB — HEPATIC FUNCTION PANEL
ALT: 15 U/L (ref 0–44)
AST: 24 U/L (ref 15–41)
Albumin: 3.9 g/dL (ref 3.5–5.0)
Alkaline Phosphatase: 23 U/L — ABNORMAL LOW (ref 38–126)
Bilirubin, Direct: 0.1 mg/dL (ref 0.0–0.2)
Indirect Bilirubin: 0.6 mg/dL (ref 0.3–0.9)
Total Bilirubin: 0.7 mg/dL (ref 0.3–1.2)
Total Protein: 6.4 g/dL — ABNORMAL LOW (ref 6.5–8.1)

## 2021-01-11 LAB — CBC
HCT: 36.5 % — ABNORMAL LOW (ref 39.0–52.0)
Hemoglobin: 12.7 g/dL — ABNORMAL LOW (ref 13.0–17.0)
MCH: 31.4 pg (ref 26.0–34.0)
MCHC: 34.8 g/dL (ref 30.0–36.0)
MCV: 90.3 fL (ref 80.0–100.0)
Platelets: 186 10*3/uL (ref 150–400)
RBC: 4.04 MIL/uL — ABNORMAL LOW (ref 4.22–5.81)
RDW: 14.1 % (ref 11.5–15.5)
WBC: 6.6 10*3/uL (ref 4.0–10.5)
nRBC: 0 % (ref 0.0–0.2)

## 2021-01-11 LAB — BASIC METABOLIC PANEL
Anion gap: 8 (ref 5–15)
BUN: 20 mg/dL (ref 8–23)
CO2: 25 mmol/L (ref 22–32)
Calcium: 9 mg/dL (ref 8.9–10.3)
Chloride: 97 mmol/L — ABNORMAL LOW (ref 98–111)
Creatinine, Ser: 1.08 mg/dL (ref 0.61–1.24)
GFR, Estimated: >60 mL/min (ref >60)
Glucose, Bld: 125 mg/dL — ABNORMAL HIGH (ref 70–99)
Potassium: 3.9 mmol/L (ref 3.5–5.1)
Sodium: 130 mmol/L — ABNORMAL LOW (ref 135–145)

## 2021-01-11 LAB — CK: Total CK: 297 U/L (ref 49–397)

## 2021-01-11 LAB — RESP PANEL BY RT-PCR (FLU A&B, COVID) ARPGX2
Influenza A by PCR: NEGATIVE
Influenza B by PCR: NEGATIVE
SARS Coronavirus 2 by RT PCR: POSITIVE — AB

## 2021-01-11 LAB — TROPONIN I (HIGH SENSITIVITY)
Troponin I (High Sensitivity): 23 ng/L — ABNORMAL HIGH (ref <18)
Troponin I (High Sensitivity): 30 ng/L — ABNORMAL HIGH (ref <18)

## 2021-01-11 IMAGING — DX DG PORTABLE PELVIS
1 series · 1 of 1 positions shown · non-contrast
Comparison: None.

CLINICAL DATA: Hip pain fall

EXAM:
PORTABLE PELVIS 1-2 VIEWS

[pelvis ap]
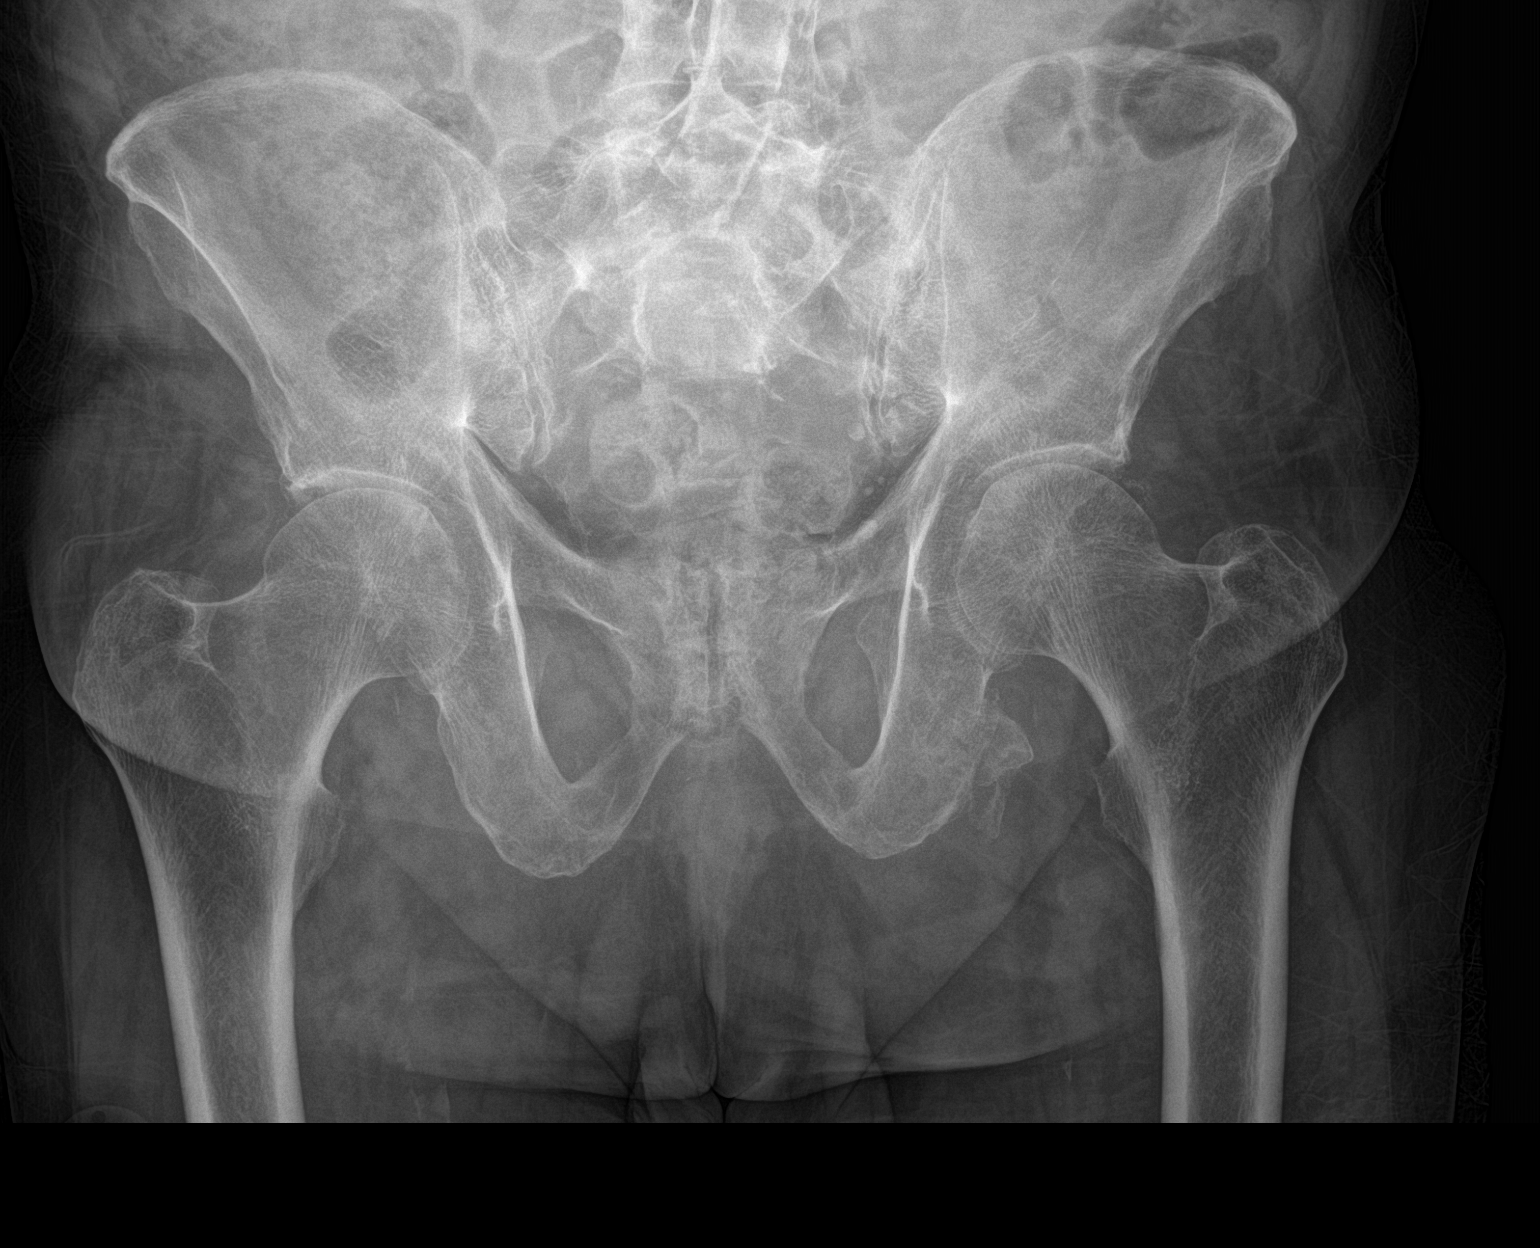

[1 of 1 positions shown; findings below may reference images not displayed]

FINDINGS: No fracture or malalignment. Mild degenerative changes of both hips.
Vascular calcifications. Possible remote avulsion or dystrophic
calcification at left ischial tuberosity
IMPRESSION: 1. No definite acute osseous abnormality.
2. Amorphous calcification near left ischial tuberosity could
reflect remote injury or possible dystrophic calcification

## 2021-01-11 MED ORDER — IOHEXOL 350 MG/ML SOLN
80.0000 mL | Freq: Once | INTRAVENOUS | Status: AC | PRN
  Administered 2021-01-11: 80 mL via INTRAVENOUS

## 2021-01-11 MED ORDER — NIRMATRELVIR/RITONAVIR (PAXLOVID)TABLET: 3.0000 | ORAL_TABLET | Freq: Two times a day (BID) | ORAL | 0 refills | Status: AC

## 2021-01-11 MED ORDER — LACTATED RINGERS IV BOLUS
1000.0000 mL | Freq: Once | INTRAVENOUS | Status: AC
  Administered 2021-01-11: 1000 mL via INTRAVENOUS

## 2021-01-11 MED ORDER — ACETAMINOPHEN 325 MG PO TABS
650.0000 mg | ORAL_TABLET | Freq: Once | ORAL | Status: AC | PRN
  Administered 2021-01-11: 650 mg via ORAL

## 2021-01-11 MED ORDER — ACETAMINOPHEN 325 MG PO TABS
ORAL_TABLET | ORAL | Status: AC
  Filled 2021-01-11: qty 2

## 2021-01-11 NOTE — ED Triage Notes (Signed)
Pt comes into the ED via EMS from home with c/o intermittent spells with dizziness, today worse , unsteady gait with a fall today, no injuries or pain, nausea and vomiting Lives with wife. 99.1 78HR 143/85 93%RA

## 2021-01-11 NOTE — ED Notes (Signed)
Portable xray at bedside.

## 2021-01-11 NOTE — ED Provider Notes (Signed)
[facility] Emergency Department Provider Note  ____________________________________________   Event Date/Time   First MD Initiated Contact with Patient 01/11/21 1743     (approximate)  I have reviewed the triage vital signs and the nursing notes.   HISTORY  Chief Complaint Fall and Dizziness   HPI William Campbell is a 87 y.o. male with a past medical history of HTN, GERD, BPH, chronic back pain and CAD as well as some peripheral neuropathy who presents accompanied by his wife for assessment of weakness in his legs and some dizziness experience today.  He states he was feeling so weak that he fell onto the floor onto his bottom without hitting his head and was able to get up.  His wife was also able to get him up and so called EMS.  Patient states he has had months of some lightheadedness episodes that usually occur when he is standing but they usually do not cause any weakness in his legs.  He states that when he was standing earlier this morning he felt like he could not walk normally.  Does endorse a mild cough last couple days he denies any LOC falls or injuries or any other associated symptoms including chest pain, shortness of breath, abdominal pain, nausea, vomiting, diarrhea, dysuria, vision changes, or clear other focal numbness weakness other than what he perceives as some new weakness in his bilateral hip flexors.         Past Medical History:  Diagnosis Date   BPH (benign prostatic hypertrophy)    Coronary artery disease    GERD (gastroesophageal reflux disease)    History of colonic polyps    Osteoarthritis of neck     Patient Active Problem List   Diagnosis Date Noted   SOB (shortness of breath) 03/11/2014   Elevated blood pressure 03/11/2014   Back pain 03/11/2014    Past Surgical History:  Procedure Laterality Date   CARDIAC CATHETERIZATION  03/2014   [facility]. mild 2 vessel CAD. LAD: 30% mid, RCA: 20% ostial.    ROTATOR CUFF REPAIR  Right     Prior to Admission medications   Medication Sig Start Date End Date Taking? Authorizing Provider  nirmatrelvir/ritonavir EUA (PAXLOVID) 20 x 150 MG & 10 x 100MG  TABS Take 3 tablets by mouth 2 (two) times daily for 5 days. Patient GFR is >60. Take nirmatrelvir (150 mg) two tablets twice daily for 5 days and ritonavir (100 mg) one tablet twice daily for 5 days. 01/11/21 01/16/21 Yes Gilles ChiquitoSmith, Leone Mobley P, MD  albuterol (PROVENTIL HFA;VENTOLIN HFA) 108 (90 Base) MCG/ACT inhaler INL 2 INHALATIONS ITL Q 6 H PRF WHZ 08/08/17   [provider]  aspirin 81 MG tablet Take 1 tablet (81 mg total) by mouth daily. 03/22/14   Iran OuchArida, Muhammad A, MD  Cholecalciferol (VITAMIN D3) 2000 UNITS capsule Take 2,000 Units by mouth daily.    [provider]  Cyanocobalamin (VITAMIN B-12) 50 MCG LOZG Take by mouth 2 (two) times a week.    [provider]  omeprazole (PRILOSEC) 20 MG capsule Take 20 mg by mouth as needed.    [provider]  vitamin C (ASCORBIC ACID) 500 MG tablet Take 500 mg by mouth daily.    [provider]    Allergies Patient has no known allergies.  Family History  Problem Relation Age of Onset   Prostate cancer Neg Hx    Kidney cancer Neg Hx    Bladder Cancer Neg Hx  Social History Social History   Tobacco Use   Smoking status: Never   Smokeless tobacco: Never  Vaping Use   Vaping Use: Never used  Substance Use Topics   Alcohol use: Yes   Drug use: No    Review of Systems  Review of Systems  Constitutional:  Negative for chills and fever.  HENT:  Negative for sore throat.   Eyes:  Negative for pain.  Respiratory:  Positive for cough. Negative for stridor.   Cardiovascular:  Negative for chest pain.  Gastrointestinal:  Negative for vomiting.  Skin:  Negative for rash.  Neurological:  Positive for dizziness (Subacute to chronic.  Intermittent and usually occurs when standing.) and weakness. Negative for seizures, loss of  consciousness and headaches.  Psychiatric/Behavioral:  Negative for suicidal ideas.   All other systems reviewed and are negative.    ____________________________________________   PHYSICAL EXAM:  VITAL SIGNS: ED Triage Vitals  Enc Vitals Group     BP 01/11/21 1443 (!) 166/82     Pulse Rate 01/11/21 1441 73     Resp 01/11/21 1441 18     Temp 01/11/21 1441 (!) 102 F (38.9 C)     Temp Source 01/11/21 1441 Oral     SpO2 01/11/21 1441 94 %     Weight --      Height --      Head Circumference --      Peak Flow --      Pain Score 01/11/21 1420 0     Pain Loc --      Pain Edu? --      Excl. in GC? --    Vitals:   01/11/21 1443 01/11/21 1814  BP: (!) 166/82 (!) 155/77  Pulse:  62  Resp:  16  Temp:  98.6 F (37 C)  SpO2:  98%   Physical Exam Vitals and nursing note reviewed.  Constitutional:      Appearance: He is well-developed.  HENT:     Head: Normocephalic and atraumatic.     Right Ear: External ear normal.     Left Ear: External ear normal.     Nose: Nose normal.  Eyes:     Conjunctiva/sclera: Conjunctivae normal.  Cardiovascular:     Rate and Rhythm: Normal rate and regular rhythm.     Heart sounds: No murmur heard. Pulmonary:     Effort: Pulmonary effort is normal. No respiratory distress.     Breath sounds: Normal breath sounds.  Abdominal:     Palpations: Abdomen is soft.     Tenderness: There is no abdominal tenderness.  Musculoskeletal:     Cervical back: Neck supple.  Skin:    General: Skin is warm and dry.  Neurological:     Mental Status: He is alert and oriented to person, place, and time.     Motor: Weakness present.  Psychiatric:        Mood and Affect: Mood normal.    Cranial nerves II through XII grossly intact.  No pronator drift.  No finger dysmetria.  Seems to have symmetric strength in all extremities and on supine exam has full strength of the bilateral lower extremities.  Sensation intact to light touch in all extremities.  On trial  of ambulation patient is extremely unsteady and unable to ambulate without holding onto this examiner or the wall.  ____________________________________________   LABS (all labs ordered are listed, but only abnormal results are displayed)  Labs Reviewed  RESP PANEL BY RT-PCR (  FLU A&B, COVID) ARPGX2 - Abnormal; Notable for the following components:      Result Value   SARS Coronavirus 2 by RT PCR POSITIVE (*)    All other components within normal limits  BASIC METABOLIC PANEL - Abnormal; Notable for the following components:   Sodium 130 (*)    Chloride 97 (*)    Glucose, Bld 125 (*)    All other components within normal limits  CBC - Abnormal; Notable for the following components:   RBC 4.04 (*)    Hemoglobin 12.7 (*)    HCT 36.5 (*)    All other components within normal limits  HEPATIC FUNCTION PANEL - Abnormal; Notable for the following components:   Total Protein 6.4 (*)    Alkaline Phosphatase 23 (*)    All other components within normal limits  TROPONIN I (HIGH SENSITIVITY) - Abnormal; Notable for the following components:   Troponin I (High Sensitivity) 23 (*)    All other components within normal limits  TROPONIN I (HIGH SENSITIVITY) - Abnormal; Notable for the following components:   Troponin I (High Sensitivity) 30 (*)    All other components within normal limits  CK  URINALYSIS, COMPLETE (UACMP) WITH MICROSCOPIC   ____________________________________________  EKG  Sinus rhythm with a ventricular to 75, nonspecific ST changes in inferior and lateral leads versus artifact without other evidence of acute ischemia or significant arrhythmia. ____________________________________________  RADIOLOGY  ED MD interpretation: CTA head and neck shows no evidence of acute intracranial abnormality including subacute CVA.  There is no large vessel occlusion.  There is fairly severe and chronic appearing stenosis of the right V4 and severe bilateral artery stenosis.  No  dissection or other acute intracranial or cervical process.  Chest x-ray shows no effusion, edema, pneumothorax, focal consolidation or other acute thoracic process.  Pelvis x-ray shows no fracture dislocation but some calcifications of the left ischial tuberosity likely from remote injury.  Official radiology report(s): CT ANGIO HEAD NECK W WO CM  Result Date: 01/11/2021 CLINICAL DATA:  Neuro deficit, acute, stroke suspected. Intermittent dizzy spells. Unsteady gait with a fall today. EXAM: CT ANGIOGRAPHY HEAD AND NECK TECHNIQUE: Multidetector CT imaging of the head and neck was performed using the standard protocol during bolus administration of intravenous contrast. Multiplanar CT image reconstructions and MIPs were obtained to evaluate the vascular anatomy. Carotid stenosis measurements (when applicable) are obtained utilizing NASCET criteria, using the distal internal carotid diameter as the denominator. CONTRAST:  90mL OMNIPAQUE IOHEXOL 350 MG/ML SOLN COMPARISON:  None. FINDINGS: CT HEAD FINDINGS Brain: There is no evidence of an acute infarct, intracranial hemorrhage, mass, midline shift, or extra-axial fluid collection. Cerebral atrophy is within normal limits for age. Vascular: Calcified atherosclerosis at the skull base. Skull: No fracture or suspicious osseous lesion. Sinuses: Mild mucosal thickening in the paranasal sinuses. Clear mastoid air cells. Orbits: Bilateral cataract extraction. Review of the MIP images confirms the above findings CTA NECK FINDINGS Aortic arch: Standard 3 vessel aortic arch with mild-to-moderate calcified atherosclerotic plaque. No significant arch vessel origin stenosis. Right carotid system: Patent with a moderate amount of calcified plaque at the carotid bifurcation. No evidence of a significant stenosis or dissection. Left carotid system: Patent with a small amount of calcified plaque at the carotid bifurcation. No evidence of a significant stenosis or dissection.  Vertebral arteries: The vertebral arteries are patent without evidence of a significant stenosis of the dominant right vertebral artery. The left vertebral arises from the proximal aspect of the  left subclavian artery and the left V1 segment is partially obscured by streak artifact although no significant stenosis is evident more distally. Skeleton: Moderate disc degeneration and asymmetric left facet arthrosis in the cervical spine. Other neck: No evidence of cervical lymphadenopathy or mass. Upper chest: Clear lung apices. Review of the MIP images confirms the above findings CTA HEAD FINDINGS Anterior circulation: The internal carotid arteries are patent from skull base to carotid termini with mild atherosclerotic plaque bilaterally not resulting in a significant stenosis. ACAs and MCAs are patent without evidence of a proximal branch occlusion or significant proximal stenosis. No aneurysm is identified. Posterior circulation: The intracranial vertebral arteries are patent with the left ending in PICA. There is multifocal severe stenosis of the right V4 segment. The basilar artery is patent and developmentally small with a superimposed severe stenosis proximally. There are large posterior communicating arteries with hypoplastic P1 segments bilaterally. Both PCAs are patent without evidence of a significant proximal stenosis. No aneurysm is identified. Venous sinuses: Patent. Anatomic variants: Fetal origin of the PCAs. Review of the MIP images confirms the above findings IMPRESSION: 1. No evidence of acute intracranial abnormality. 2. No large vessel occlusion. 3. Intracranial atherosclerosis with severe right V4 and severe basilar artery stenoses. 4. Carotid atherosclerosis without significant stenosis. 5. Aortic Atherosclerosis (ICD10-I70.0). Electronically Signed   By: Sebastian Ache M.D.   On: 01/11/2021 20:06   DG Chest 2 View  Result Date: 01/11/2021 CLINICAL DATA:  Cough, new fever EXAM: CHEST - 2 VIEW  COMPARISON:  02/22/2014 FINDINGS: The heart size and mediastinal contours are within normal limits. Both lungs are clear. No pleural effusion. The visualized skeletal structures are unremarkable. IMPRESSION: No acute process chest. Electronically Signed   By: Guadlupe Spanish M.D.   On: 01/11/2021 15:47   DG Pelvis Portable  Result Date: 01/11/2021 CLINICAL DATA:  Hip pain fall EXAM: PORTABLE PELVIS 1-2 VIEWS COMPARISON:  None. FINDINGS: No fracture or malalignment. Mild degenerative changes of both hips. Vascular calcifications. Possible remote avulsion or dystrophic calcification at left ischial tuberosity IMPRESSION: 1. No definite acute osseous abnormality. 2. Amorphous calcification near left ischial tuberosity could reflect remote injury or possible dystrophic calcification Electronically Signed   By: Jasmine Pang M.D.   On: 01/11/2021 18:54    ____________________________________________   PROCEDURES  Procedure(s) performed (including Critical Care):  Procedures   ____________________________________________   INITIAL IMPRESSION / ASSESSMENT AND PLAN / ED COURSE      Patient presents with above to history exam for assessment of increased weakness in his legs and inability get up after falling onto his buttocks earlier today.  He denies any acute pain or other clear associated symptoms other than slight cough that he think started today.  On arrival he is febrile at 10 2 with otherwise stable vital signs on room air.  He has a nonfocal supine neuro exam but is unable to ambulate with steady gait and appears very unsteady and high risk for a fall.  Regards fell on unsteadiness and weakness today he is complaining mostly about his feeling weak in his legs.  Differential includes CVA, metabolic derangements, myositis, spinal injury, metabolic derangements and acute infectious process.   CTA head and neck shows no evidence of acute intracranial abnormality including subacute CVA.  There  is no large vessel occlusion.  There is fairly severe and chronic appearing stenosis of the right V4 and severe bilateral artery stenosis.  No dissection or other acute intracranial or cervical process.  I advised  patient of findings on CTA with fairly severe stenosis and this is certainly could be causing the dizziness he has been describing over the last couple weeks and months.  Unclear if this is related to the incident today as it seems today was fairly acute.  Chest x-ray shows no effusion, edema, pneumothorax, focal consolidation or other acute thoracic process.  Pelvis x-ray shows no fracture dislocation but some calcifications of the left ischial tuberosity likely from remote injury.  ECG with nonspecific findings and troponin relatively flat at 23 and 30.  Patient denies any chest pain I have a low suspicion for ACS at this time.  Patient is COVID-positive.  BMP remarkable for an NA of 130 without other significant electrolyte or metabolic derangements.  CBC without leukocytosis or significant anemia.  Hepatic function panel unremarkable.  I discussed the patient at length concerns that he is very unsteady on his feet and that after falling today due to some weakness he was able to get up even with assistance of his wife.  I am also concerned that stenosis noted on CTA may be contributing to this although suspect this is likely chronic.  Strongly advised patient to be admitted for observation given his weakness which is suspect is related to his COVID.  However he states he does not wish to be admitted and wishes to be discharged home as he is feeling better.  Explained my concerns that he could fall and hit his head and sustained life-threatening injury and that I was not 100% certain the etiology of his weakness today and recommended further evaluation in the hospital possibly including additional imaging or lab work.  However he is adamant that he wishes to go home.  I think he has capacity to  make this decision.  He was discharged home against my advice.  Rx for Paxil been written.    ____________________________________________   FINAL CLINICAL IMPRESSION(S) / ED DIAGNOSES  Final diagnoses:  COVID  Weakness  Dizziness    Medications  acetaminophen (TYLENOL) 325 MG tablet (has no administration in time range)  acetaminophen (TYLENOL) tablet 650 mg (650 mg Oral Given 01/11/21 1445)  lactated ringers bolus 1,000 mL (1,000 mLs Intravenous New Bag/Given 01/11/21 1843)  iohexol (OMNIPAQUE) 350 MG/ML injection 80 mL (80 mLs Intravenous Contrast Given 01/11/21 1907)     ED Discharge Orders          Ordered    nirmatrelvir/ritonavir EUA (PAXLOVID) 20 x 150 MG & 10 x  TABS  2 times daily        01/11/21 2050             Note:  This document was prepared using Dragon voice recognition software and may include unintentional dictation errors.    Gilles Chiquito, MD 01/11/21 [id number]

## 2021-01-26 DIAGNOSIS — R2689 Other abnormalities of gait and mobility: Secondary | ICD-10-CM | POA: Diagnosis not present

## 2021-01-26 DIAGNOSIS — R29898 Other symptoms and signs involving the musculoskeletal system: Secondary | ICD-10-CM | POA: Diagnosis not present

## 2021-01-26 DIAGNOSIS — R634 Abnormal weight loss: Secondary | ICD-10-CM | POA: Diagnosis not present

## 2021-01-26 DIAGNOSIS — G629 Polyneuropathy, unspecified: Secondary | ICD-10-CM | POA: Diagnosis not present

## 2021-02-19 DIAGNOSIS — Z23 Encounter for immunization: Secondary | ICD-10-CM | POA: Diagnosis not present

## 2021-02-28 DIAGNOSIS — R29898 Other symptoms and signs involving the musculoskeletal system: Secondary | ICD-10-CM | POA: Diagnosis not present

## 2021-02-28 DIAGNOSIS — R2689 Other abnormalities of gait and mobility: Secondary | ICD-10-CM | POA: Diagnosis not present

## 2021-02-28 DIAGNOSIS — G4486 Cervicogenic headache: Secondary | ICD-10-CM | POA: Diagnosis not present

## 2021-02-28 DIAGNOSIS — R262 Difficulty in walking, not elsewhere classified: Secondary | ICD-10-CM | POA: Diagnosis not present

## 2021-02-28 DIAGNOSIS — G609 Hereditary and idiopathic neuropathy, unspecified: Secondary | ICD-10-CM | POA: Diagnosis not present

## 2021-02-28 DIAGNOSIS — R42 Dizziness and giddiness: Secondary | ICD-10-CM | POA: Diagnosis not present

## 2021-02-28 DIAGNOSIS — R200 Anesthesia of skin: Secondary | ICD-10-CM | POA: Diagnosis not present

## 2021-03-13 DIAGNOSIS — Z23 Encounter for immunization: Secondary | ICD-10-CM | POA: Diagnosis not present

## 2021-06-13 DIAGNOSIS — R0609 Other forms of dyspnea: Secondary | ICD-10-CM | POA: Diagnosis not present

## 2021-06-13 DIAGNOSIS — J449 Chronic obstructive pulmonary disease, unspecified: Secondary | ICD-10-CM | POA: Diagnosis not present

## 2021-07-31 DIAGNOSIS — Z79899 Other long term (current) drug therapy: Secondary | ICD-10-CM | POA: Diagnosis not present

## 2021-07-31 DIAGNOSIS — Z1389 Encounter for screening for other disorder: Secondary | ICD-10-CM | POA: Diagnosis not present

## 2021-07-31 DIAGNOSIS — K219 Gastro-esophageal reflux disease without esophagitis: Secondary | ICD-10-CM | POA: Diagnosis not present

## 2021-07-31 DIAGNOSIS — I700 Atherosclerosis of aorta: Secondary | ICD-10-CM | POA: Diagnosis not present

## 2021-07-31 DIAGNOSIS — Z0000 Encounter for general adult medical examination without abnormal findings: Secondary | ICD-10-CM | POA: Diagnosis not present

## 2021-07-31 DIAGNOSIS — G629 Polyneuropathy, unspecified: Secondary | ICD-10-CM | POA: Diagnosis not present

## 2021-07-31 DIAGNOSIS — J449 Chronic obstructive pulmonary disease, unspecified: Secondary | ICD-10-CM | POA: Diagnosis not present

## 2022-01-06 ENCOUNTER — Other Ambulatory Visit: Payer: Self-pay

## 2022-01-06 ENCOUNTER — Encounter: Payer: Self-pay | Admitting: Emergency Medicine

## 2022-01-06 ENCOUNTER — Emergency Department: Payer: Medicare Other

## 2022-01-06 ENCOUNTER — Emergency Department
Admission: EM | Admit: 2022-01-06 | Discharge: 2022-01-06 | Disposition: A | Discharge status: Home / Self Care | Payer: Medicare Other | Attending: Emergency Medicine | Admitting: Emergency Medicine

## 2022-01-06 DIAGNOSIS — I700 Atherosclerosis of aorta: Secondary | ICD-10-CM | POA: Insufficient documentation

## 2022-01-06 DIAGNOSIS — S29001A Unspecified injury of muscle and tendon of front wall of thorax, initial encounter: Secondary | ICD-10-CM | POA: Diagnosis present

## 2022-01-06 DIAGNOSIS — S2222XA Fracture of body of sternum, initial encounter for closed fracture: Secondary | ICD-10-CM | POA: Diagnosis not present

## 2022-01-06 DIAGNOSIS — S0990XA Unspecified injury of head, initial encounter: Secondary | ICD-10-CM | POA: Diagnosis not present

## 2022-01-06 DIAGNOSIS — Y92410 Unspecified street and highway as the place of occurrence of the external cause: Secondary | ICD-10-CM | POA: Diagnosis not present

## 2022-01-06 DIAGNOSIS — S2220XA Unspecified fracture of sternum, initial encounter for closed fracture: Secondary | ICD-10-CM | POA: Insufficient documentation

## 2022-01-06 DIAGNOSIS — R0789 Other chest pain: Secondary | ICD-10-CM | POA: Diagnosis not present

## 2022-01-06 DIAGNOSIS — I2510 Atherosclerotic heart disease of native coronary artery without angina pectoris: Secondary | ICD-10-CM | POA: Diagnosis not present

## 2022-01-06 DIAGNOSIS — Z041 Encounter for examination and observation following transport accident: Secondary | ICD-10-CM | POA: Diagnosis not present

## 2022-01-06 DIAGNOSIS — N400 Enlarged prostate without lower urinary tract symptoms: Secondary | ICD-10-CM | POA: Insufficient documentation

## 2022-01-06 LAB — COMPREHENSIVE METABOLIC PANEL
ALT: 21 U/L (ref 0–44)
AST: 25 U/L (ref 15–41)
Albumin: 4.6 g/dL (ref 3.5–5.0)
Alkaline Phosphatase: 28 U/L — ABNORMAL LOW (ref 38–126)
Anion gap: 10 (ref 5–15)
BUN: 25 mg/dL — ABNORMAL HIGH (ref 8–23)
CO2: 26 mmol/L (ref 22–32)
Calcium: 9.8 mg/dL (ref 8.9–10.3)
Chloride: 100 mmol/L (ref 98–111)
Creatinine, Ser: 1.01 mg/dL (ref 0.61–1.24)
GFR, Estimated: >60 mL/min (ref >60)
Glucose, Bld: 113 mg/dL — ABNORMAL HIGH (ref 70–99)
Potassium: 4.2 mmol/L (ref 3.5–5.1)
Sodium: 136 mmol/L (ref 135–145)
Total Bilirubin: 1.1 mg/dL (ref 0.3–1.2)
Total Protein: 7.6 g/dL (ref 6.5–8.1)

## 2022-01-06 LAB — CBC WITH DIFFERENTIAL/PLATELET
Abs Immature Granulocytes: 0.05 10*3/uL (ref 0.00–0.07)
Basophils Absolute: 0.1 10*3/uL (ref 0.0–0.1)
Basophils Relative: 1 %
Eosinophils Absolute: 0.1 10*3/uL (ref 0.0–0.5)
Eosinophils Relative: 1 %
HCT: 43.4 % (ref 39.0–52.0)
Hemoglobin: 14.4 g/dL (ref 13.0–17.0)
Immature Granulocytes: 0 %
Lymphocytes Relative: 14 %
Lymphs Abs: 1.6 10*3/uL (ref 0.7–4.0)
MCH: 30.5 pg (ref 26.0–34.0)
MCHC: 33.2 g/dL (ref 30.0–36.0)
MCV: 91.9 fL (ref 80.0–100.0)
Monocytes Absolute: 0.9 10*3/uL (ref 0.1–1.0)
Monocytes Relative: 8 %
Neutro Abs: 8.5 10*3/uL — ABNORMAL HIGH (ref 1.7–7.7)
Neutrophils Relative %: 76 %
Platelets: 241 10*3/uL (ref 150–400)
RBC: 4.72 MIL/uL (ref 4.22–5.81)
RDW: 13.7 % (ref 11.5–15.5)
WBC: 11.2 10*3/uL — ABNORMAL HIGH (ref 4.0–10.5)
nRBC: 0 % (ref 0.0–0.2)

## 2022-01-06 LAB — TROPONIN I (HIGH SENSITIVITY)
Troponin I (High Sensitivity): 11 ng/L (ref <18)
Troponin I (High Sensitivity): 14 ng/L (ref <18)

## 2022-01-06 MED ORDER — IOHEXOL 300 MG/ML  SOLN
100.0000 mL | Freq: Once | INTRAMUSCULAR | Status: AC | PRN
  Administered 2022-01-06: 100 mL via INTRAVENOUS
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
## 2022-01-06 NOTE — Discharge Instructions (Signed)
Take Tylenol 650 mg and ibuprofen 400 mg every 6 hours for pain.  Use your incentive spirometry to take deep breaths daily.  See your doctor this week for a follow-up appointment.  If you have any new or worsening symptoms, call your doctor right away or come back to the emergency department.

## 2022-01-06 NOTE — ED Notes (Signed)
Pt given Incentive Spirometer 

## 2022-01-09 DIAGNOSIS — R972 Elevated prostate specific antigen [PSA]: Secondary | ICD-10-CM | POA: Diagnosis not present

## 2022-01-09 DIAGNOSIS — R799 Abnormal finding of blood chemistry, unspecified: Secondary | ICD-10-CM | POA: Diagnosis not present

## 2022-01-09 DIAGNOSIS — I2510 Atherosclerotic heart disease of native coronary artery without angina pectoris: Secondary | ICD-10-CM | POA: Diagnosis not present

## 2022-01-09 DIAGNOSIS — Z79899 Other long term (current) drug therapy: Secondary | ICD-10-CM | POA: Diagnosis not present

## 2022-01-09 DIAGNOSIS — J449 Chronic obstructive pulmonary disease, unspecified: Secondary | ICD-10-CM | POA: Diagnosis not present

## 2022-01-09 DIAGNOSIS — Z0000 Encounter for general adult medical examination without abnormal findings: Secondary | ICD-10-CM | POA: Diagnosis not present

## 2022-01-09 DIAGNOSIS — G629 Polyneuropathy, unspecified: Secondary | ICD-10-CM | POA: Diagnosis not present

## 2022-01-09 DIAGNOSIS — R946 Abnormal results of thyroid function studies: Secondary | ICD-10-CM | POA: Diagnosis not present

## 2022-02-15 DIAGNOSIS — Z23 Encounter for immunization: Secondary | ICD-10-CM | POA: Diagnosis not present

## 2022-08-19 DIAGNOSIS — H353131 Nonexudative age-related macular degeneration, bilateral, early dry stage: Secondary | ICD-10-CM | POA: Diagnosis not present

## 2022-08-26 DIAGNOSIS — J449 Chronic obstructive pulmonary disease, unspecified: Secondary | ICD-10-CM | POA: Diagnosis not present

## 2022-08-26 DIAGNOSIS — R7303 Prediabetes: Secondary | ICD-10-CM | POA: Diagnosis not present

## 2022-08-26 DIAGNOSIS — I2510 Atherosclerotic heart disease of native coronary artery without angina pectoris: Secondary | ICD-10-CM | POA: Diagnosis not present

## 2022-08-26 DIAGNOSIS — G629 Polyneuropathy, unspecified: Secondary | ICD-10-CM | POA: Diagnosis not present

## 2022-08-26 DIAGNOSIS — E039 Hypothyroidism, unspecified: Secondary | ICD-10-CM | POA: Diagnosis not present

## 2023-01-20 DIAGNOSIS — R7309 Other abnormal glucose: Secondary | ICD-10-CM | POA: Diagnosis not present

## 2023-01-20 DIAGNOSIS — J449 Chronic obstructive pulmonary disease, unspecified: Secondary | ICD-10-CM | POA: Diagnosis not present

## 2023-01-20 DIAGNOSIS — G629 Polyneuropathy, unspecified: Secondary | ICD-10-CM | POA: Diagnosis not present

## 2023-01-20 DIAGNOSIS — E039 Hypothyroidism, unspecified: Secondary | ICD-10-CM | POA: Diagnosis not present

## 2023-01-20 DIAGNOSIS — I2510 Atherosclerotic heart disease of native coronary artery without angina pectoris: Secondary | ICD-10-CM | POA: Diagnosis not present

## 2023-01-20 DIAGNOSIS — Z0000 Encounter for general adult medical examination without abnormal findings: Secondary | ICD-10-CM | POA: Diagnosis not present

## 2023-02-13 DIAGNOSIS — R0609 Other forms of dyspnea: Secondary | ICD-10-CM | POA: Diagnosis not present

## 2023-02-13 DIAGNOSIS — J449 Chronic obstructive pulmonary disease, unspecified: Secondary | ICD-10-CM | POA: Diagnosis not present

## 2023-02-17 DIAGNOSIS — H353131 Nonexudative age-related macular degeneration, bilateral, early dry stage: Secondary | ICD-10-CM | POA: Diagnosis not present

## 2023-02-24 DIAGNOSIS — Z23 Encounter for immunization: Secondary | ICD-10-CM | POA: Diagnosis not present

## 2024-06-13 ENCOUNTER — Inpatient Hospital Stay
Admission: EM | Admit: 2024-06-13 | Discharge: 2024-06-18 | Disposition: A | Discharge status: Skilled Nursing Facility | Source: Home / Self Care | Attending: Internal Medicine | Admitting: Internal Medicine

## 2024-06-13 ENCOUNTER — Other Ambulatory Visit: Payer: Self-pay

## 2024-06-13 ENCOUNTER — Emergency Department

## 2024-06-13 DIAGNOSIS — G629 Polyneuropathy, unspecified: Secondary | ICD-10-CM

## 2024-06-13 DIAGNOSIS — S72001A Fracture of unspecified part of neck of right femur, initial encounter for closed fracture: Principal | ICD-10-CM | POA: Diagnosis present

## 2024-06-13 DIAGNOSIS — J449 Chronic obstructive pulmonary disease, unspecified: Secondary | ICD-10-CM | POA: Insufficient documentation

## 2024-06-13 DIAGNOSIS — I2510 Atherosclerotic heart disease of native coronary artery without angina pectoris: Secondary | ICD-10-CM | POA: Insufficient documentation

## 2024-06-13 DIAGNOSIS — E039 Hypothyroidism, unspecified: Secondary | ICD-10-CM | POA: Insufficient documentation

## 2024-06-13 DIAGNOSIS — D649 Anemia, unspecified: Secondary | ICD-10-CM

## 2024-06-13 MED ORDER — TRANEXAMIC ACID-NACL 1000-0.7 MG/100ML-% IV SOLN
1000.0000 mg | Freq: Once | INTRAVENOUS | Status: AC
  Administered 2024-06-14: 1000 mg via INTRAVENOUS
  Filled 2024-06-13: qty 100

## 2024-06-13 MED ORDER — CEFAZOLIN SODIUM-DEXTROSE 2-4 GM/100ML-% IV SOLN
2.0000 g | INTRAVENOUS | Status: AC
  Administered 2024-06-14: 2 g via INTRAVENOUS

## 2024-06-13 NOTE — ED Provider Notes (Signed)
 "  [facility] Provider Note    Event Date/Time   First MD Initiated Contact with Patient 06/13/24 2255     (approximate)   History   Fall   HPI  William Campbell is a 89 y.o. male   Past medical history of CAD GERD BPH here with a fall and hip injury.  He was in the kitchen tripped over a rug and fell onto his right side buttock area.  Did not strike his head.  Sustained no other injuries but could not get up due to the hip pain.  Was otherwise in his regular state of health and has no other acute medical complaints.  No blood thinners and last p.o. was yesterday morning  Independent Historian contributed to assessment above: Friend at bedside corroborates information above  External Medical Documents Reviewed: Previous outpatient notes      Physical Exam   Triage Vital Signs: ED Triage Vitals  Encounter Vitals Group     BP 06/13/24 1949 (!) 150/83     Girls Systolic BP Percentile --      Girls Diastolic BP Percentile --      Boys Systolic BP Percentile --      Boys Diastolic BP Percentile --      Pulse Rate 06/13/24 1949 (!) 56     Resp 06/13/24 1949 18     Temp 06/13/24 1949 98.2 F (36.8 C)     Temp Source 06/13/24 1949 Oral     SpO2 06/13/24 1949 100 %     Weight 06/13/24 1947 150 lb (68 kg)     Height 06/13/24 1947 5' 10 (1.778 m)     Head Circumference --      Peak Flow --      Pain Score 06/13/24 1947 7     Pain Loc --      Pain Education --      Exclude from Growth Chart --     Most recent vital signs: Vitals:   06/14/24 0000 06/14/24 0329  BP: (!) 165/93   Pulse: 60   Resp: 17   Temp:  98.6 F (37 C)  SpO2: 99%     General: Awake, no distress.  CV:  Good peripheral perfusion.  Resp:  Normal effort.  Abd:  No distention.  Other:  Pleasant gentleman oriented hypertensive otherwise vital signs normal.  Unable to range at the right hip due to pain.  Neurovascular intact to the affected extremity.  No bony tenderness  to the remainder of that same extremity at other joints.  Other extremities unaffected and able to range fully.  Assessment head to toe of head, neck, thorax, back and abdomen atraumatic.   ED Results / Procedures / Treatments   Labs (all labs ordered are listed, but only abnormal results are displayed) Labs Reviewed  BASIC METABOLIC PANEL WITH GFR - Abnormal; Notable for the following components:      Result Value   Glucose, Bld 115 (*)    All other components within normal limits  CBC WITH DIFFERENTIAL/PLATELET - Abnormal; Notable for the following components:   RBC 4.03 (*)    Hemoglobin 11.9 (*)    HCT 35.4 (*)    Neutro Abs 7.8 (*)    All other components within normal limits  RETICULOCYTES - Abnormal; Notable for the following components:   RBC. 4.06 (*)    All other components within normal limits  PROTIME-INR  VITAMIN B12  FOLATE  IRON AND TIBC  FERRITIN  TSH  TYPE AND SCREEN     I ordered and reviewed the above labs they are notable for cell count electrolytes unremarkable  RADIOLOGY I independently reviewed and interpreted x-ray of the hip and see no obvious fracture I also reviewed radiologist's formal read.   PROCEDURES:  Critical Care performed: No  Procedures   MEDICATIONS ORDERED IN ED: Medications  ceFAZolin  (ANCEF ) IVPB 2g/100 mL premix (0 g Intravenous Hold 06/14/24 0501)  melatonin tablet 5 mg (has no administration in time range)  HYDROcodone -acetaminophen  (NORCO/VICODIN) 5-325 MG per tablet 1-2 tablet (has no administration in time range)  morphine  (PF) 2 MG/ML injection 1 mg (has no administration in time range)  tranexamic acid  (CYKLOKAPRON ) IVPB 1,000 mg (0 mg Intravenous Stopped 06/14/24 0145)    External physician / consultants:  I spoke with Dr Tobie of orthopedics regarding care plan for this patient.   IMPRESSION / MDM / ASSESSMENT AND PLAN / ED COURSE  I reviewed the triage vital signs and the nursing notes.                                 Patient's presentation is most consistent with acute presentation with potential threat to life or bodily function.  Differential diagnosis includes, but is not limited to, hip fracture or dislocation, other blunt traumatic injury, medical reasons for fall like syncope infection etc. but less likely    MDM:    Mechanical fall leading to isolated right hip fracture.  Very subtle on x-ray so recommended for CT and then MRI by Dr. Earnestine Tobie.  Keep NPO.  Likely surgery in the morning.  No other injuries noted on head to toe examination of patient.  No other acute medical complaints or other concerns for other reasons leading to his fall.  Admit to hospitalist service.       FINAL CLINICAL IMPRESSION(S) / ED DIAGNOSES   Final diagnoses:  Closed fracture of right hip, initial encounter [facility])     Rx / DC Orders   ED Discharge Orders     None        Note:  This document was prepared using Dragon voice recognition software and may include unintentional dictation errors.    Cyrena Mylar, MD 06/14/24 [phone]  "

## 2024-06-13 NOTE — Progress Notes (Signed)
Full consult note and discussion with patient to follow tomorrow.  Called by ED staff. Imaging reviewed.  - Plan for surgery tomorrow, likely afternoon.  - NPO after midnight - Hold anticoagulation - Admit to Hospitalist team.  

## 2024-06-13 NOTE — ED Notes (Signed)
 First Nurse Note: Pt arrives via [facility] from home with complaints of fall tonight after tripping over a rug. Endorses pain to the R femur - worse with movement. States he sat down while tripping. Not on thinners - denies LOC nor hitting his head.  Received 1000mg  Tylenol  given IV.   20GLAC   VS with EMS 155/84 98% 60s [id number]

## 2024-06-13 NOTE — ED Triage Notes (Signed)
 Pt reports he sat down abruptly onto a wooden floor in the kitchen while cooking. States R leg got wrenched under him when this happened and then pt was unable to get up on his own. Unsure if he hit head but denies LOC. Reports R leg pain and hip pain and sacral pain. Pt alert and oriented on arrival. Breathing unlabord speaking in full sentences.  \ Pt here with wife with dementia. Family to come and take wife home.

## 2024-06-14 ENCOUNTER — Inpatient Hospital Stay

## 2024-06-14 ENCOUNTER — Emergency Department

## 2024-06-14 ENCOUNTER — Inpatient Hospital Stay: Admitting: Anesthesiology

## 2024-06-14 ENCOUNTER — Encounter: Payer: Self-pay | Admitting: Internal Medicine

## 2024-06-14 ENCOUNTER — Encounter
Admission: EM | Disposition: A | Discharge status: Skilled Nursing Facility | Payer: Self-pay | Source: Home / Self Care | Attending: Internal Medicine

## 2024-06-14 DIAGNOSIS — G629 Polyneuropathy, unspecified: Secondary | ICD-10-CM

## 2024-06-14 DIAGNOSIS — S72001A Fracture of unspecified part of neck of right femur, initial encounter for closed fracture: Secondary | ICD-10-CM | POA: Diagnosis not present

## 2024-06-14 DIAGNOSIS — I2510 Atherosclerotic heart disease of native coronary artery without angina pectoris: Secondary | ICD-10-CM | POA: Insufficient documentation

## 2024-06-14 DIAGNOSIS — E039 Hypothyroidism, unspecified: Secondary | ICD-10-CM | POA: Insufficient documentation

## 2024-06-14 DIAGNOSIS — J449 Chronic obstructive pulmonary disease, unspecified: Secondary | ICD-10-CM | POA: Insufficient documentation

## 2024-06-14 DIAGNOSIS — D649 Anemia, unspecified: Secondary | ICD-10-CM

## 2024-06-14 LAB — RETICULOCYTES
Immature Retic Fract: 7.4 % (ref 2.3–15.9)
RBC.: 4.06 MIL/uL — ABNORMAL LOW (ref 4.22–5.81)
Retic Count, Absolute: 54 10*3/uL (ref 19.0–186.0)
Retic Ct Pct: 1.3 % (ref 0.4–3.1)

## 2024-06-14 LAB — PROTIME-INR
INR: 1.1 (ref 0.8–1.2)
Prothrombin Time: 14.6 s (ref 11.4–15.2)

## 2024-06-14 LAB — CBC WITH DIFFERENTIAL/PLATELET
Abs Immature Granulocytes: 0.04 10*3/uL (ref 0.00–0.07)
Basophils Absolute: 0 10*3/uL (ref 0.0–0.1)
Basophils Relative: 0 %
Eosinophils Absolute: 0 10*3/uL (ref 0.0–0.5)
Eosinophils Relative: 0 %
HCT: 35.4 % — ABNORMAL LOW (ref 39.0–52.0)
Hemoglobin: 11.9 g/dL — ABNORMAL LOW (ref 13.0–17.0)
Immature Granulocytes: 0 %
Lymphocytes Relative: 14 %
Lymphs Abs: 1.4 10*3/uL (ref 0.7–4.0)
MCH: 29.5 pg (ref 26.0–34.0)
MCHC: 33.6 g/dL (ref 30.0–36.0)
MCV: 87.8 fL (ref 80.0–100.0)
Monocytes Absolute: 0.9 10*3/uL (ref 0.1–1.0)
Monocytes Relative: 8 %
Neutro Abs: 7.8 10*3/uL — ABNORMAL HIGH (ref 1.7–7.7)
Neutrophils Relative %: 78 %
Platelets: 230 10*3/uL (ref 150–400)
RBC: 4.03 MIL/uL — ABNORMAL LOW (ref 4.22–5.81)
RDW: 13.7 % (ref 11.5–15.5)
WBC: 10.1 10*3/uL (ref 4.0–10.5)
nRBC: 0 % (ref 0.0–0.2)

## 2024-06-14 LAB — BASIC METABOLIC PANEL WITH GFR
Anion gap: 11 (ref 5–15)
BUN: 22 mg/dL (ref 8–23)
CO2: 26 mmol/L (ref 22–32)
Calcium: 9.8 mg/dL (ref 8.9–10.3)
Chloride: 101 mmol/L (ref 98–111)
Creatinine, Ser: 1.01 mg/dL (ref 0.61–1.24)
GFR, Estimated: >60 mL/min
Glucose, Bld: 115 mg/dL — ABNORMAL HIGH (ref 70–99)
Potassium: 4.3 mmol/L (ref 3.5–5.1)
Sodium: 137 mmol/L (ref 135–145)

## 2024-06-14 LAB — TYPE AND SCREEN
ABO/RH(D): A NEG
Antibody Screen: NEGATIVE

## 2024-06-14 MED ORDER — SODIUM CHLORIDE 0.9 % IV SOLN: INTRAVENOUS | Status: DC

## 2024-06-14 MED ORDER — EPHEDRINE 5 MG/ML INJ
INTRAVENOUS | Status: AC
  Filled 2024-06-14: qty 5

## 2024-06-14 MED ORDER — FENTANYL CITRATE (PF) 100 MCG/2ML IJ SOLN
INTRAMUSCULAR | Status: AC
  Filled 2024-06-14: qty 2

## 2024-06-14 MED ORDER — METOCLOPRAMIDE HCL 5 MG/ML IJ SOLN: 5.0000 mg | Freq: Three times a day (TID) | INTRAMUSCULAR | Status: DC | PRN

## 2024-06-14 MED ORDER — METHOCARBAMOL 1000 MG/10ML IJ SOLN: 500.0000 mg | Freq: Four times a day (QID) | INTRAMUSCULAR | Status: DC | PRN

## 2024-06-14 MED ORDER — FLEET ENEMA RE ENEM: 1.0000 | ENEMA | Freq: Once | RECTAL | Status: DC | PRN

## 2024-06-14 MED ORDER — ONDANSETRON HCL 4 MG PO TABS: 4.0000 mg | ORAL_TABLET | Freq: Four times a day (QID) | ORAL | Status: DC | PRN

## 2024-06-14 MED ORDER — VITAMIN B-12 1000 MCG PO TABS
5000.0000 ug | ORAL_TABLET | ORAL | Status: DC
  Administered 2024-06-16 – 2024-06-18 (×2): 5000 ug via ORAL
  Filled 2024-06-14 (×4): qty 5

## 2024-06-14 MED ORDER — LACTATED RINGERS IV SOLN: INTRAVENOUS | Status: DC | PRN

## 2024-06-14 MED ORDER — ONDANSETRON HCL 4 MG/2ML IJ SOLN
INTRAMUSCULAR | Status: AC
  Filled 2024-06-14: qty 2

## 2024-06-14 MED ORDER — BISACODYL 10 MG RE SUPP
10.0000 mg | Freq: Every day | RECTAL | Status: DC | PRN
  Administered 2024-06-18: 10 mg via RECTAL
  Filled 2024-06-14: qty 1

## 2024-06-14 MED ORDER — KETOROLAC TROMETHAMINE 15 MG/ML IJ SOLN
7.5000 mg | Freq: Four times a day (QID) | INTRAMUSCULAR | Status: AC
  Administered 2024-06-15: 7.5 mg via INTRAVENOUS
  Filled 2024-06-14: qty 1

## 2024-06-14 MED ORDER — MELATONIN 5 MG PO TABS: 5.0000 mg | ORAL_TABLET | Freq: Every evening | ORAL | Status: DC | PRN

## 2024-06-14 MED ORDER — HYDROCODONE-ACETAMINOPHEN 5-325 MG PO TABS: 1.0000 | ORAL_TABLET | Freq: Four times a day (QID) | ORAL | Status: DC | PRN

## 2024-06-14 MED ORDER — FENTANYL CITRATE (PF) 100 MCG/2ML IJ SOLN
INTRAMUSCULAR | Status: DC | PRN
  Administered 2024-06-14: 25 ug via INTRAVENOUS
  Administered 2024-06-14: 50 ug via INTRAVENOUS
  Administered 2024-06-14: 25 ug via INTRAVENOUS

## 2024-06-14 MED ORDER — METHOCARBAMOL 1000 MG/10ML IJ SOLN: 500.0000 mg | Freq: Three times a day (TID) | INTRAMUSCULAR | Status: DC | PRN

## 2024-06-14 MED ORDER — ONDANSETRON HCL 4 MG/2ML IJ SOLN: 4.0000 mg | Freq: Four times a day (QID) | INTRAMUSCULAR | Status: DC | PRN

## 2024-06-14 MED ORDER — MORPHINE SULFATE (PF) 2 MG/ML IV SOLN: 1.0000 mg | INTRAVENOUS | Status: DC | PRN

## 2024-06-14 MED ORDER — KETOROLAC TROMETHAMINE 30 MG/ML IJ SOLN
INTRAMUSCULAR | Status: DC | PRN
  Administered 2024-06-14: 15 mg via INTRAVENOUS

## 2024-06-14 MED ORDER — ROCURONIUM BROMIDE 100 MG/10ML IV SOLN
INTRAVENOUS | Status: DC | PRN
  Administered 2024-06-14: 50 mg via INTRAVENOUS

## 2024-06-14 MED ORDER — PHENYLEPHRINE 80 MCG/ML (10ML) SYRINGE FOR IV PUSH (FOR BLOOD PRESSURE SUPPORT)
PREFILLED_SYRINGE | INTRAVENOUS | Status: DC | PRN
  Administered 2024-06-14: 40 ug via INTRAVENOUS

## 2024-06-14 MED ORDER — OXYCODONE HCL 5 MG PO TABS
2.5000 mg | ORAL_TABLET | ORAL | Status: DC | PRN
  Administered 2024-06-16 – 2024-06-17 (×3): 5 mg via ORAL
  Filled 2024-06-14 (×2): qty 1

## 2024-06-14 MED ORDER — ROCURONIUM BROMIDE 10 MG/ML (PF) SYRINGE
PREFILLED_SYRINGE | INTRAVENOUS | Status: AC
  Filled 2024-06-14: qty 10

## 2024-06-14 MED ORDER — 0.9 % SODIUM CHLORIDE (POUR BTL) OPTIME
TOPICAL | Status: DC | PRN
  Administered 2024-06-14: 1000 mL

## 2024-06-14 MED ORDER — METHOCARBAMOL 500 MG PO TABS: 500.0000 mg | ORAL_TABLET | Freq: Three times a day (TID) | ORAL | Status: DC | PRN

## 2024-06-14 MED ORDER — ENOXAPARIN SODIUM 40 MG/0.4ML IJ SOSY
40.0000 mg | PREFILLED_SYRINGE | INTRAMUSCULAR | Status: DC
  Administered 2024-06-15 – 2024-06-18 (×4): 40 mg via SUBCUTANEOUS
  Filled 2024-06-14 (×4): qty 0.4

## 2024-06-14 MED ORDER — KETOROLAC TROMETHAMINE 30 MG/ML IJ SOLN
INTRAMUSCULAR | Status: AC
  Filled 2024-06-14: qty 1

## 2024-06-14 MED ORDER — ONDANSETRON HCL 4 MG/2ML IJ SOLN
INTRAMUSCULAR | Status: DC | PRN
  Administered 2024-06-14: 4 mg via INTRAVENOUS

## 2024-06-14 MED ORDER — PROPOFOL 10 MG/ML IV BOLUS
INTRAVENOUS | Status: AC
  Filled 2024-06-14: qty 20

## 2024-06-14 MED ORDER — HYDROMORPHONE HCL 1 MG/ML IJ SOLN
0.2000 mg | INTRAMUSCULAR | Status: DC | PRN
  Administered 2024-06-15: 0.4 mg via INTRAVENOUS
  Filled 2024-06-14: qty 0.5

## 2024-06-14 MED ORDER — ACETAMINOPHEN 10 MG/ML IV SOLN
INTRAVENOUS | Status: AC
  Filled 2024-06-14: qty 100

## 2024-06-14 MED ORDER — BUPIVACAINE LIPOSOME 1.3 % IJ SUSP
INTRAMUSCULAR | Status: AC
  Filled 2024-06-14: qty 20

## 2024-06-14 MED ORDER — OXYCODONE HCL 5 MG PO TABS
5.0000 mg | ORAL_TABLET | ORAL | Status: DC | PRN
  Filled 2024-06-14: qty 1

## 2024-06-14 MED ORDER — HYDROMORPHONE HCL 1 MG/ML IJ SOLN: 0.2500 mg | INTRAMUSCULAR | Status: DC | PRN

## 2024-06-14 MED ORDER — BUPIVACAINE LIPOSOME 1.3 % IJ SUSP
INTRAMUSCULAR | Status: DC | PRN
  Administered 2024-06-14: 40 mL via INTRAMUSCULAR

## 2024-06-14 MED ORDER — CEFAZOLIN SODIUM-DEXTROSE 2-4 GM/100ML-% IV SOLN
2.0000 g | Freq: Four times a day (QID) | INTRAVENOUS | Status: AC
  Administered 2024-06-14 – 2024-06-15 (×3): 2 g via INTRAVENOUS
  Filled 2024-06-14 (×3): qty 100

## 2024-06-14 MED ORDER — SENNA 8.6 MG PO TABS
1.0000 | ORAL_TABLET | Freq: Every day | ORAL | Status: DC
  Administered 2024-06-14 – 2024-06-16 (×3): 8.6 mg via ORAL
  Filled 2024-06-14 (×3): qty 1

## 2024-06-14 MED ORDER — PROPOFOL 10 MG/ML IV BOLUS
INTRAVENOUS | Status: DC | PRN
  Administered 2024-06-14: 80 mg via INTRAVENOUS

## 2024-06-14 MED ORDER — LIDOCAINE HCL (PF) 2 % IJ SOLN
INTRAMUSCULAR | Status: AC
  Filled 2024-06-14: qty 5

## 2024-06-14 MED ORDER — METHOCARBAMOL 500 MG PO TABS: 500.0000 mg | ORAL_TABLET | Freq: Four times a day (QID) | ORAL | Status: DC | PRN

## 2024-06-14 MED ORDER — DEXAMETHASONE SOD PHOSPHATE PF 10 MG/ML IJ SOLN
INTRAMUSCULAR | Status: DC | PRN
  Administered 2024-06-14: 10 mg via INTRAVENOUS

## 2024-06-14 MED ORDER — OXYCODONE HCL 5 MG/5ML PO SOLN: 5.0000 mg | Freq: Once | ORAL | Status: DC | PRN

## 2024-06-14 MED ORDER — ACETAMINOPHEN 10 MG/ML IV SOLN
INTRAVENOUS | Status: DC | PRN
  Administered 2024-06-14: 1000 mg via INTRAVENOUS

## 2024-06-14 MED ORDER — FAMOTIDINE 20 MG PO TABS
10.0000 mg | ORAL_TABLET | Freq: Every day | ORAL | Status: DC
  Filled 2024-06-14 (×2): qty 1

## 2024-06-14 MED ORDER — METOCLOPRAMIDE HCL 5 MG PO TABS: 5.0000 mg | ORAL_TABLET | Freq: Three times a day (TID) | ORAL | Status: DC | PRN

## 2024-06-14 MED ORDER — DEXAMETHASONE SOD PHOSPHATE PF 10 MG/ML IJ SOLN
INTRAMUSCULAR | Status: AC
  Filled 2024-06-14: qty 1

## 2024-06-14 MED ORDER — LACTATED RINGERS IV SOLN: INTRAVENOUS | Status: DC

## 2024-06-14 MED ORDER — TRAMADOL HCL 50 MG PO TABS: 50.0000 mg | ORAL_TABLET | Freq: Four times a day (QID) | ORAL | Status: DC | PRN

## 2024-06-14 MED ORDER — PHENYLEPHRINE 80 MCG/ML (10ML) SYRINGE FOR IV PUSH (FOR BLOOD PRESSURE SUPPORT)
PREFILLED_SYRINGE | INTRAVENOUS | Status: AC
  Filled 2024-06-14: qty 10

## 2024-06-14 MED ORDER — VITAMIN D 25 MCG (1000 UNIT) PO TABS
5000.0000 [IU] | ORAL_TABLET | Freq: Two times a day (BID) | ORAL | Status: DC
  Administered 2024-06-14 – 2024-06-18 (×7): 5000 [IU] via ORAL
  Filled 2024-06-14 (×8): qty 5

## 2024-06-14 MED ORDER — SUGAMMADEX SODIUM 200 MG/2ML IV SOLN
INTRAVENOUS | Status: DC | PRN
  Administered 2024-06-14: 140 mg via INTRAVENOUS

## 2024-06-14 MED ORDER — BUPIVACAINE HCL (PF) 0.5 % IJ SOLN
INTRAMUSCULAR | Status: AC
  Filled 2024-06-14: qty 30

## 2024-06-14 MED ORDER — ACETAMINOPHEN 500 MG PO TABS
1000.0000 mg | ORAL_TABLET | Freq: Three times a day (TID) | ORAL | Status: DC
  Administered 2024-06-15 – 2024-06-17 (×7): 1000 mg via ORAL
  Filled 2024-06-14 (×10): qty 2

## 2024-06-14 MED ORDER — CEFAZOLIN SODIUM-DEXTROSE 2-4 GM/100ML-% IV SOLN
INTRAVENOUS | Status: AC
  Filled 2024-06-14: qty 100

## 2024-06-14 MED ORDER — SENNOSIDES-DOCUSATE SODIUM 8.6-50 MG PO TABS: 1.0000 | ORAL_TABLET | Freq: Every evening | ORAL | Status: DC | PRN

## 2024-06-14 MED ORDER — OXYCODONE HCL 5 MG PO TABS: 5.0000 mg | ORAL_TABLET | Freq: Once | ORAL | Status: DC | PRN

## 2024-06-14 MED ORDER — EPHEDRINE SULFATE-NACL 50-0.9 MG/10ML-% IV SOSY
PREFILLED_SYRINGE | INTRAVENOUS | Status: DC | PRN
  Administered 2024-06-14 (×2): 5 mg via INTRAVENOUS

## 2024-06-14 MED ORDER — LIDOCAINE HCL (CARDIAC) PF 100 MG/5ML IV SOSY
PREFILLED_SYRINGE | INTRAVENOUS | Status: DC | PRN
  Administered 2024-06-14: 100 mg via INTRAVENOUS

## 2024-06-14 NOTE — Assessment & Plan Note (Signed)
 Patient denies chest pain Will get baseline EKG

## 2024-06-14 NOTE — H&P (Signed)
 H&P reviewed. No significant changes noted.

## 2024-06-14 NOTE — Anesthesia Procedure Notes (Signed)
 Procedure Name: Intubation Date/Time: 06/14/2024 3:04 PM  Performed by: Jackye Spanner, CRNAPre-anesthesia Checklist: Patient identified, Patient being monitored, Timeout performed, Emergency Drugs available and Suction available Patient Re-evaluated:Patient Re-evaluated prior to induction Oxygen Delivery Method: Circle system utilized Preoxygenation: Pre-oxygenation with 100% oxygen Induction Type: IV induction Ventilation: Mask ventilation without difficulty Laryngoscope Size: 3 and McGrath Grade View: Grade I Tube type: Oral Tube size: 7.0 mm Number of attempts: 1 Airway Equipment and Method: Stylet Placement Confirmation: ETT inserted through vocal cords under direct vision, positive ETCO2 and breath sounds checked- equal and bilateral Secured at: 21 cm Tube secured with: Tape Dental Injury: Teeth and Oropharynx as per pre-operative assessment  Comments: Smooth atraumatic intubation, no complications noted.

## 2024-06-14 NOTE — ED Notes (Signed)
 Report given to Silvia, RN from same day surgery

## 2024-06-14 NOTE — ED Notes (Signed)
 Patient transported to CT

## 2024-06-14 NOTE — Assessment & Plan Note (Addendum)
 Hemoglobin 11.9, down from 13 about 5 months prior CT hip with no evidence of hematoma Patient denies black or bloody stool Adding on an anemia panel Can continue to monitor

## 2024-06-15 ENCOUNTER — Encounter: Payer: Self-pay | Admitting: Orthopedic Surgery

## 2024-06-15 LAB — CBC
HCT: 29.2 % — ABNORMAL LOW (ref 39.0–52.0)
Hemoglobin: 9.4 g/dL — ABNORMAL LOW (ref 13.0–17.0)
MCH: 29.6 pg (ref 26.0–34.0)
MCHC: 32.2 g/dL (ref 30.0–36.0)
MCV: 91.8 fL (ref 80.0–100.0)
Platelets: 182 10*3/uL (ref 150–400)
RBC: 3.18 MIL/uL — ABNORMAL LOW (ref 4.22–5.81)
RDW: 14 % (ref 11.5–15.5)
WBC: 8.7 10*3/uL (ref 4.0–10.5)
nRBC: 0 % (ref 0.0–0.2)

## 2024-06-15 LAB — BASIC METABOLIC PANEL WITH GFR
Anion gap: 9 (ref 5–15)
BUN: 24 mg/dL — ABNORMAL HIGH (ref 8–23)
CO2: 24 mmol/L (ref 22–32)
Calcium: 8.8 mg/dL — ABNORMAL LOW (ref 8.9–10.3)
Chloride: 103 mmol/L (ref 98–111)
Creatinine, Ser: 1.09 mg/dL (ref 0.61–1.24)
GFR, Estimated: >60 mL/min
Glucose, Bld: 128 mg/dL — ABNORMAL HIGH (ref 70–99)
Potassium: 4.3 mmol/L (ref 3.5–5.1)
Sodium: 136 mmol/L (ref 135–145)

## 2024-06-15 MED ORDER — TRAMADOL HCL 50 MG PO TABS: 50.0000 mg | ORAL_TABLET | Freq: Four times a day (QID) | ORAL | 0 refills | Status: AC | PRN

## 2024-06-15 MED ORDER — ENOXAPARIN SODIUM 40 MG/0.4ML IJ SOSY: 40.0000 mg | PREFILLED_SYRINGE | INTRAMUSCULAR | 0 refills | Status: AC

## 2024-06-15 MED ORDER — OXYCODONE HCL 5 MG PO TABS: 2.5000 mg | ORAL_TABLET | ORAL | 0 refills | Status: AC | PRN

## 2024-06-15 NOTE — Progress Notes (Signed)
 Physical Therapy Treatment Patient Details Name: William Campbell MRN: [id number] DOB: 1929/07/25 Today's Date: 06/15/2024   History of Present Illness Pt is a 89 yo male s/p R IM. PMH of COPD, CAD, peripheral neuropathy, orthostatic hypotension.    PT Comments  Patient alert, motivated for therapy. Did not quantify R hip pain but did state it seemed a bit better than the AM, less pain signs/symptoms noted as well by PT (dilaudid  administered prior to PT session ~41minutes). Pt still required min-modA for transfers and ambulation with RW. Ambulated ~64ft, but fatigued quickly, close chair follow necessary. Returned to supine with needs in reach at end of session, minAx2. The patient would benefit from further skilled PT intervention to continue to progress towards goals.    If plan is discharge home, recommend the following: Two people to help with walking and/or transfers;Two people to help with bathing/dressing/bathroom;Direct supervision/assist for medications management;Help with stairs or ramp for entrance;Assistance with cooking/housework;Assist for transportation   Can travel by private vehicle     No  Equipment Recommendations  Other (comment) (TBD)    Recommendations for Other Services       Precautions / Restrictions Precautions Precautions: Fall Recall of Precautions/Restrictions: Intact Restrictions Weight Bearing Restrictions Per Provider Order: Yes RLE Weight Bearing Per Provider Order: Weight bearing as tolerated     Mobility  Bed Mobility Overal bed mobility: Needs Assistance Bed Mobility: Sit to Supine       Sit to supine: +2 for safety/equipment, Min assist   General bed mobility comments: assist for trunk and BLE    Transfers Overall transfer level: Needs assistance Equipment used: Rolling walker (2 wheels) Transfers: Sit to/from Stand Sit to Stand: Min assist, Mod assist                Ambulation/Gait Ambulation/Gait assistance: Mod  assist Gait Distance (Feet): 12 Feet Assistive device: Rolling walker (2 wheels)         General Gait Details: significant offweighting needed from PT at least modA. assistance with RW management as well, max verbal/tactile cues close chair follow   Stairs             Wheelchair Mobility     Tilt Bed    Modified Rankin (Stroke Patients Only)       Balance Overall balance assessment: Needs assistance Sitting-balance support: No upper extremity supported, Feet supported Sitting balance-Leahy Scale: Fair     Standing balance support: Bilateral upper extremity supported, Reliant on assistive device for balance Standing balance-Leahy Scale: Poor                              Communication Communication Communication: No apparent difficulties  Cognition Arousal: Alert Behavior During Therapy: WFL for tasks assessed/performed                             Following commands: Intact      Cueing Cueing Techniques: Verbal cues, Tactile cues  Exercises Other Exercises Other Exercises: seated marching, LAQ x10 bilaterally    General Comments        Pertinent Vitals/Pain Pain Assessment Pain Assessment: Faces Pain Score: 8  Faces Pain Scale: Hurts a little bit Pain Location: R hip but didn't quantify Pain Descriptors / Indicators: Aching, Discomfort, Grimacing Pain Intervention(s): Limited activity within patient's tolerance, Monitored during session, Repositioned    Home Living Family/patient expects to be discharged to::  Private residence Living Arrangements: Spouse/significant other (Is caregiver for spouse who has Alzheimer's dementia) Available Help at Discharge: Family;Friend(s) (has a son who lives out of town, he is currently in town assisting his mother (patient's spouse) and managing the household while patient is in the hospital.) Type of Home: House Home Access: Stairs to enter   [employer] of Steps: 3 STE garage  (patient's primary entrance), 4-5 STE at front entrance (bilateral hand rails).  Patient reports front entry can have ramp installed if needed.   Home Layout: One level Home Equipment: Shower seat;Hand held shower head;Grab bars - tub/shower Additional Comments: Patient reports that house was built for accessibility as he and his wife age to allow them to remain at home.    Prior Function            PT Goals (current goals can now be found in the care plan section) Acute Rehab PT Goals Patient Stated Goal: to go home PT Goal Formulation: With patient Time For Goal Achievement: 06/29/24 Potential to Achieve Goals: Good Progress towards PT goals: Progressing toward goals    Frequency    BID      PT Plan      Co-evaluation              AM-PAC PT 6 Clicks Mobility   Outcome Measure  Help needed turning from your back to your side while in a flat bed without using bedrails?: A Lot Help needed moving from lying on your back to sitting on the side of a flat bed without using bedrails?: A Lot Help needed moving to and from a bed to a chair (including a wheelchair)?: A Lot Help needed standing up from a chair using your arms (e.g., wheelchair or bedside chair)?: A Lot Help needed to walk in hospital room?: A Lot Help needed climbing 3-5 steps with a railing? : Total 6 Click Score: 11    End of Session Equipment Utilized During Treatment: Gait belt Activity Tolerance: Patient tolerated treatment well Patient left: in chair;with call bell/phone within reach;with chair alarm set Nurse Communication: Mobility status PT Visit Diagnosis: Difficulty in walking, not elsewhere classified (R26.2);Muscle weakness (generalized) (M62.81);Pain Pain - Right/Left: Right Pain - part of body: Hip     Time: 1351-1411 PT Time Calculation (min) (ACUTE ONLY): 20 min  Charges:    $Gait Training: 8-22 mins $Therapeutic Exercise: 8-22 mins $Therapeutic Activity: 8-22 mins PT General  Charges $$ ACUTE PT VISIT: 1 Visit                     Doyal Shams PT, DPT 3:33 PM,06/15/24

## 2024-06-15 NOTE — TOC Initial Note (Signed)
 Transition of Care [facility]) - Initial/Assessment Note    Patient Details  Name: William Campbell MRN: [id number] Date of Birth: 1930/03/12  [facility]) CM/SW Contact:    Alvaro Louder, LCSW Phone Number: 06/15/2024, 1:19 PM  Clinical Narrative:      Per chart review patient is from home PCP is from [facility]. LCSWA faxed out patient to SNF's in [place]. TOC to present facilities to patient at the bedside.            TOC to follow for discharge         Patient Goals and CMS Choice            Expected Discharge Plan and Services                                              Prior Living Arrangements/Services                       Activities of Daily Living      Permission Sought/Granted                  Emotional Assessment              Admission diagnosis:  Closed fracture of right hip, initial encounter (HCC) [S72.001A] Closed right hip fracture, initial encounter (HCC) [S72.001A] Patient Active Problem List   Diagnosis Date Noted   Closed right hip fracture, initial encounter (HCC) 06/14/2024   COPD (chronic obstructive pulmonary disease) (HCC) 06/14/2024   Hypothyroidism 06/14/2024   Peripheral neuropathy / gait imbalance 06/14/2024   Anemia, unknown acuity 06/14/2024   Coronary artery disease    SOB (shortness of breath) 03/11/2014   Elevated blood pressure 03/11/2014   Back pain 03/11/2014   PCP:  [facility] Pharmacy:   [id number] [place],  - [address] AT [facility] OF [address] [place] [address] Phone: [phone] Fax: [phone]     Social Drivers of Health (SDOH) Social History: SDOH Screenings   Food Insecurity: No Food Insecurity (06/15/2024)  Housing: Low Risk (06/15/2024)  Transportation Needs: No Transportation Needs (06/15/2024)  Utilities: Not At Risk (06/15/2024)  Financial Resource Strain: Low Risk   (01/26/2024)   Received from [facility]  Social Connections: Socially Integrated (06/15/2024)  Tobacco Use: Low Risk (06/14/2024)   SDOH Interventions:     Readmission Risk Interventions     No data to display

## 2024-06-15 NOTE — TOC CM/SW Note (Signed)
 Transition of Care [facility]) CM/SW Note   [facility]) - Inpatient Brief Assessment   Patient Details  Name: William Campbell MRN: [id number] Date of Birth: 01-22-1930  Transition of Care [facility]) CM/SW Contact:    Alvaro Louder, LCSW Phone Number: 06/15/2024, 9:06 AM   Clinical Narrative:  Per chart review TOC consulted for SNF Placement/ Home health/DME. LCSWA to follow recommendations placed by PT and OT.  TOC to follow for discharge    Transition of Care Asessment: Insurance and Status: Insurance coverage has been reviewed Patient has primary care physician: Yes Home environment has been reviewed: single family home Prior level of function:: Ox4 Prior/Current Home Services: No current home services Social Drivers of Health Review: SDOH reviewed no interventions necessary Readmission risk has been reviewed: Yes Transition of care needs: no transition of care needs at this time

## 2024-06-15 NOTE — Progress Notes (Signed)
" °  Subjective: 1 Day Post-Op Procedures (LRB): FIXATION, FRACTURE, INTERTROCHANTERIC, WITH INTRAMEDULLARY ROD (Right) Patient reports pain as mild.   Patient is well, and has had no acute complaints or problems Plan is to go Home versus rehab after hospital stay. Negative for chest pain and shortness of breath Fever: no Gastrointestinal: Negative for nausea and vomiting  Objective: Vital signs in last 24 hours: Temp:  [97.5 F (36.4 C)-99.5 F (37.5 C)] 97.5 F (36.4 C) (02/03 0546) Pulse Rate:  [52-70] 55 (02/03 0546) Resp:  [12-19] 18 (02/03 0546) BP: (112-176)/(64-96) 129/73 (02/03 0546) SpO2:  [92 %-100 %] 96 % (02/03 0546)  Intake/Output from previous day:  Intake/Output Summary (Last 24 hours) at 06/15/2024 0647 Last data filed at 06/15/2024 0500 Gross per 24 hour  Intake 1810.26 ml  Output 25 ml  Net 1785.26 ml    Intake/Output this shift: Total I/O In: 400.3 [I.V.:200.3; IV Piggyback:200] Out: -   Labs: Recent Labs    06/13/24 2358 06/15/24 0437  HGB 11.9* 9.4*   Recent Labs    06/13/24 2358 06/15/24 0437  WBC 10.1 8.7  RBC 4.03*  4.06* 3.18*  HCT 35.4* 29.2*  PLT 230 182   Recent Labs    06/13/24 2358  NA 137  K 4.3  CL 101  CO2 26  BUN 22  CREATININE 1.01  GLUCOSE 115*  CALCIUM 9.8   Recent Labs    06/13/24 2358  INR 1.1     EXAM General - Patient is Alert and Oriented Extremity - Neurovascular intact Sensation intact distally Dorsiflexion/Plantar flexion intact Compartment soft Dressing/Incision - clean, mild drainage in the honeycomb. Motor Function - intact, moving foot and toes well on exam.   Past Medical History:  Diagnosis Date   BPH (benign prostatic hypertrophy)    Coronary artery disease    GERD (gastroesophageal reflux disease)    History of colonic polyps    Osteoarthritis of neck     Assessment/Plan: 1 Day Post-Op Procedures (LRB): FIXATION, FRACTURE, INTERTROCHANTERIC, WITH INTRAMEDULLARY ROD  (Right) Principal Problem:   Closed right hip fracture, initial encounter (HCC) Active Problems:   Coronary artery disease   COPD (chronic obstructive pulmonary disease) (HCC)   Hypothyroidism   Peripheral neuropathy / gait imbalance   Anemia, unknown acuity  Estimated body mass index is 21.52 kg/m as calculated from the following:   Height as of this encounter: 5' 10 (1.778 m).   Weight as of this encounter: 68 kg. Advance diet Up with therapy  Acute blood loss anemia.  Status post hip fracture surgery.  Hemoglobin 9.4 from 11.9.  Stable.  Follow.  Discharge plan: Dressing change as needed.  No showering.  Follow-up at [facility] clinic orthopedics in 2 weeks for staple removal and x-rays of the right hip.  DVT Prophylaxis - Lovenox  and TED hose Weight-Bearing as tolerated to right leg  Krystal Doyne, PA-C Orthopaedic Surgery 06/15/2024, 6:47 AM  "

## 2024-06-16 LAB — BASIC METABOLIC PANEL WITH GFR
Anion gap: 9 (ref 5–15)
BUN: 29 mg/dL — ABNORMAL HIGH (ref 8–23)
CO2: 24 mmol/L (ref 22–32)
Calcium: 9.1 mg/dL (ref 8.9–10.3)
Chloride: 102 mmol/L (ref 98–111)
Creatinine, Ser: 1.2 mg/dL (ref 0.61–1.24)
GFR, Estimated: 57 mL/min — ABNORMAL LOW
Glucose, Bld: 92 mg/dL (ref 70–99)
Potassium: 4.4 mmol/L (ref 3.5–5.1)
Sodium: 135 mmol/L (ref 135–145)

## 2024-06-16 LAB — CBC
HCT: 30.3 % — ABNORMAL LOW (ref 39.0–52.0)
Hemoglobin: 9.9 g/dL — ABNORMAL LOW (ref 13.0–17.0)
MCH: 29.3 pg (ref 26.0–34.0)
MCHC: 32.7 g/dL (ref 30.0–36.0)
MCV: 89.6 fL (ref 80.0–100.0)
Platelets: 188 10*3/uL (ref 150–400)
RBC: 3.38 MIL/uL — ABNORMAL LOW (ref 4.22–5.81)
RDW: 14.2 % (ref 11.5–15.5)
WBC: 7.8 10*3/uL (ref 4.0–10.5)
nRBC: 0 % (ref 0.0–0.2)

## 2024-06-16 NOTE — TOC Progression Note (Signed)
 Transition of Care [facility]) - Progression Note    Patient Details  Name: William Campbell MRN: [id number] Date of Birth: 1930/03/30  Transition of Care [facility] CM/SW Contact  Daved JONETTA Hamilton, RN Phone Number: 06/16/2024, 6:42 PM  Clinical Narrative:     Spoke with patient introduced self, explained role. Presented bed offers to patient, patient requested that I speak with his son [name] [phone] for rehab decision making.   Spoke with Evan, introduced self and explained role. Presented bed offers to [employer], utilized [employer].gov ratings, Artist verbalized he will speak with patient and family and will have a choice tomorrow. Evan verbalized he will be coming to the hospital to see the patient and will be bringing patient's spouse [name] as well. Evan verbalized [name] has dementia and did inquire about services for her. This CM advised that we are unable to assist with placement for individuals that are not inpatient with us , Evan verbalized understanding.   TOC to follow up tomorrow with Evan for choice.                     Expected Discharge Plan and Services                                               Social Drivers of Health (SDOH) Interventions SDOH Screenings   Food Insecurity: No Food Insecurity (06/15/2024)  Housing: Low Risk (06/15/2024)  Transportation Needs: No Transportation Needs (06/15/2024)  Utilities: Not At Risk (06/15/2024)  Financial Resource Strain: Low Risk  (01/26/2024)   Received from [facility]  Social Connections: Socially Integrated (06/15/2024)  Tobacco Use: Low Risk (06/14/2024)    Readmission Risk Interventions     No data to display

## 2024-06-16 NOTE — Progress Notes (Signed)
 Physical Therapy Treatment Patient Details Name: William Campbell MRN: [id number] DOB: Dec 19, 1929 Today's Date: 06/16/2024   History of Present Illness Pt is a 89 yo male s/p R IM. PMH of COPD, CAD, peripheral neuropathy, orthostatic hypotension.    PT Comments  Pt was sitting in recliner upon arrival. Pt wet from incontinence. He is eager to participate and ambulate. Applied knee immobilizer while seated in hopes to improve knee buckling during ambulation. Pt still presents with knee buckling so elected to discontinue use of knee immobilizer. He still requires recliner follow for additional safety but only +1.  Chartered Loss Adjuster issued HEP handout and pt performed with assistance. Overall remains pain limited and far from baseline abilities. DC recs remain appropriate.     If plan is discharge home, recommend the following: Two people to help with walking and/or transfers;Two people to help with bathing/dressing/bathroom;Direct supervision/assist for medications management;Help with stairs or ramp for entrance;Assistance with cooking/housework;Assist for transportation     Equipment Recommendations  Other (comment) (Defer to next level of care)       Precautions / Restrictions Precautions Precautions: Fall Recall of Precautions/Restrictions: Intact Precaution/Restrictions Comments: WBAT to RLE Restrictions Weight Bearing Restrictions Per Provider Order: Yes RLE Weight Bearing Per Provider Order: Weight bearing as tolerated     Mobility  Bed Mobility Overal bed mobility: Needs Assistance Bed Mobility: Supine to Sit  Supine to sit: Used rails, Min assist, HOB elevated  General bed mobility comments: Pt was in recliner pre/post session    Transfers Overall transfer level: Needs assistance Equipment used: Rolling walker (2 wheels) Transfers: Sit to/from Stand Sit to Stand: Min assist  General transfer comment: Author applied knee immobilizer in hopes to improve gait sequencing and decrease  knee buckling  during gait. Pt was able to stand with knee immobilizer in place    Ambulation/Gait Ambulation/Gait assistance: Mod assist Gait Distance (Feet): 12 Feet Assistive device: Rolling walker (2 wheels) Gait Pattern/deviations: Step-to pattern, Antalgic, Knees buckling Gait velocity: decreased  General Gait Details: Even with knee immobilizer in place, pt continues to have R knee buckling   Balance Overall balance assessment: Needs assistance Sitting-balance support: Bilateral upper extremity supported, Feet supported Sitting balance-Leahy Scale: Good Sitting balance - Comments: posterior lean due to pain   Standing balance support: Bilateral upper extremity supported, During functional activity, Reliant on assistive device for balance Standing balance-Leahy Scale: Poor Standing balance comment: remains at high risk of falls       Communication Communication Communication: No apparent difficulties  Cognition Arousal: Alert Behavior During Therapy: WFL for tasks assessed/performed   PT - Cognitive impairments: No apparent impairments      PT - Cognition Comments: Pt is A and O x 4 Following commands: Intact      Cueing Cueing Techniques: Verbal cues, Tactile cues     General Comments General comments (skin integrity, edema, etc.): Chartered Loss Adjuster issued HEP handout and had pt perform 10 x RLE. AROM/AAROM      Pertinent Vitals/Pain Pain Assessment Pain Assessment: 0-10 Pain Score: 2  Pain Location: R thigh Pain Descriptors / Indicators: Aching, Discomfort, Grimacing Pain Intervention(s): Limited activity within patient's tolerance, Monitored during session, Premedicated before session, Repositioned     PT Goals (current goals can now be found in the care plan section) Acute Rehab PT Goals Patient Stated Goal: to go home Progress towards PT goals: Progressing toward goals    Frequency    BID       AM-PAC PT 6 Clicks Mobility  Outcome Measure  Help  needed turning from your back to your side while in a flat bed without using bedrails?: A Lot Help needed moving from lying on your back to sitting on the side of a flat bed without using bedrails?: A Lot Help needed moving to and from a bed to a chair (including a wheelchair)?: A Lot Help needed standing up from a chair using your arms (e.g., wheelchair or bedside chair)?: A Lot Help needed to walk in hospital room?: A Lot Help needed climbing 3-5 steps with a railing? : Total 6 Click Score: 11    End of Session Equipment Utilized During Treatment: Gait belt Activity Tolerance: Patient tolerated treatment well Patient left: in chair;with call bell/phone within reach;with chair alarm set Nurse Communication: Mobility status PT Visit Diagnosis: Difficulty in walking, not elsewhere classified (R26.2);Muscle weakness (generalized) (M62.81);Pain Pain - Right/Left: Right Pain - part of body: Hip     Time: 8473-8447 PT Time Calculation (min) (ACUTE ONLY): 26 min  Charges:    $Gait Training: 8-22 mins $Therapeutic Exercise: 8-22 mins $Therapeutic Activity: 8-22 mins PT General Charges $$ ACUTE PT VISIT: 1 Visit                    Rankin Essex PTA 06/16/24, 4:53 PM

## 2024-06-16 NOTE — Progress Notes (Signed)
" °  Subjective: 2 Days Post-Op Procedures (LRB): FIXATION, FRACTURE, INTERTROCHANTERIC, WITH INTRAMEDULLARY ROD (Right) Patient reports pain as mild.   Patient is well, and has had no acute complaints or problems Plan is to go Home versus rehab after hospital stay. Negative for chest pain and shortness of breath Fever: no Gastrointestinal: Negative for nausea and vomiting  Objective: Vital signs in last 24 hours: Temp:  [97.8 F (36.6 C)-98.6 F (37 C)] 98.6 F (37 C) (02/04 0227) Pulse Rate:  [51-55] 51 (02/04 0227) Resp:  [16-18] 18 (02/04 0227) BP: (127-148)/(63-91) 127/91 (02/04 0227) SpO2:  [95 %-100 %] 95 % (02/04 0227)  Intake/Output from previous day:  Intake/Output Summary (Last 24 hours) at 06/16/2024 0705 Last data filed at 06/15/2024 2300 Gross per 24 hour  Intake 379.39 ml  Output 150 ml  Net 229.39 ml    Intake/Output this shift: No intake/output data recorded.  Labs: Recent Labs    06/13/24 2358 06/15/24 0437 06/16/24 0510  HGB 11.9* 9.4* 9.9*   Recent Labs    06/15/24 0437 06/16/24 0510  WBC 8.7 7.8  RBC 3.18* 3.38*  HCT 29.2* 30.3*  PLT 182 188   Recent Labs    06/15/24 0702 06/16/24 0510  NA 136 135  K 4.3 4.4  CL 103 102  CO2 24 24  BUN 24* 29*  CREATININE 1.09 1.20  GLUCOSE 128* 92  CALCIUM 8.8* 9.1   Recent Labs    06/13/24 2358  INR 1.1     EXAM General - Patient is Alert and Oriented Extremity - Neurovascular intact Sensation intact distally Dorsiflexion/Plantar flexion intact Compartment soft Dressing/Incision - clean, mild drainage in the honeycomb.  New honeycomb dressing today. Motor Function - intact, moving foot and toes well on exam.  Ambulated 12 feet with physical therapy.  Past Medical History:  Diagnosis Date   BPH (benign prostatic hypertrophy)    Coronary artery disease    GERD (gastroesophageal reflux disease)    History of colonic polyps    Osteoarthritis of neck     Assessment/Plan: 2 Days  Post-Op Procedures (LRB): FIXATION, FRACTURE, INTERTROCHANTERIC, WITH INTRAMEDULLARY ROD (Right) Principal Problem:   Closed right hip fracture, initial encounter [facility]) Active Problems:   Coronary artery disease   COPD (chronic obstructive pulmonary disease) (HCC)   Hypothyroidism   Peripheral neuropathy / gait imbalance   Anemia, unknown acuity  Estimated body mass index is 21.52 kg/m as calculated from the following:   Height as of this encounter: 5' 10 (1.778 m).   Weight as of this encounter: 68 kg. Advance diet Up with therapy  Acute blood loss anemia.  Status post hip fracture surgery.  Hemoglobin 9.9 from 11.9.  Stable.  Follow.  Discharge plan: Dressing change as needed.  No showering.  Follow-up at [facility] clinic orthopedics in 2 weeks for staple removal and x-rays of the right hip.  DVT Prophylaxis - Lovenox  and TED hose Weight-Bearing as tolerated to right leg  Krystal Doyne, PA-C Orthopaedic Surgery 06/16/2024, 7:05 AM  "

## 2024-06-16 NOTE — Progress Notes (Signed)
 Physical Therapy Treatment Patient Details Name: William Campbell MRN: [id number] DOB: Jan 26, 1930 Today's Date: 06/16/2024   History of Present Illness Pt is a 89 yo male s/p R IM. PMH of COPD, CAD, peripheral neuropathy, orthostatic hypotension.    PT Comments  Pt is A and O x 3. Agreeable to session and motivated throughout. He continues to require assistance for all mobility, transfers, and gait. Supportive son was present at beginning of session but left prior to OOB. Discussed at length DC recs. Pt agreeable to STR. He is at high risk of falls due to R knee buckling during LLE advancement. Requested and then MD ordered knee immobilizer. Will trial prior to PM session. Pt tolerated gait to doorway of room prior to incontinence of urine episode. Acute PT will continue current POC progressing as able per pt tolerance.    If plan is discharge home, recommend the following: Two people to help with walking and/or transfers;Two people to help with bathing/dressing/bathroom;Direct supervision/assist for medications management;Help with stairs or ramp for entrance;Assistance with cooking/housework;Assist for transportation     Equipment Recommendations  Other (comment) (Defer to next level of care)       Precautions / Restrictions Precautions Precautions: Fall Recall of Precautions/Restrictions: Intact Precaution/Restrictions Comments: WBAT to RLE Restrictions Weight Bearing Restrictions Per Provider Order: Yes RLE Weight Bearing Per Provider Order: Weight bearing as tolerated     Mobility  Bed Mobility Overal bed mobility: Needs Assistance Bed Mobility: Supine to Sit  Supine to sit: Used rails, Min assist, HOB elevated  General bed mobility comments: Pt was able to progress from long sitting to short sitting with increased time + heavy use of bed rails+ HOB elevated    Transfers Overall transfer level: Needs assistance Equipment used: Rolling walker (2 wheels) Transfers: Sit to/from  Stand Sit to Stand: Min assist, Mod assist  General transfer comment: Pt was able to progress to EOB from long sitting with increased time and use of bed functions. Easily able to maintain sitting balance without assistance.    Ambulation/Gait Ambulation/Gait assistance: Mod assist Gait Distance (Feet): 12 Feet Assistive device: Rolling walker (2 wheels) Gait Pattern/deviations: Step-to pattern, Antalgic, Knees buckling Gait velocity: decreased  General Gait Details: Pt was able to ambulate without author assisting with lateral wt shift however pt has severe knee buckling. Requested knee immobilizer for use prior to PM session.    Balance Overall balance assessment: Needs assistance Sitting-balance support: Bilateral upper extremity supported, Feet supported Sitting balance-Leahy Scale: Good Sitting balance - Comments: posterior lean due to pain   Standing balance support: Bilateral upper extremity supported, During functional activity, Reliant on assistive device for balance Standing balance-Leahy Scale: Poor      Communication Communication Communication: No apparent difficulties  Cognition Arousal: Alert Behavior During Therapy: WFL for tasks assessed/performed   PT - Cognitive impairments: No apparent impairments      PT - Cognition Comments: Pt is A and O x 4 Following commands: Intact      Cueing Cueing Techniques: Verbal cues, Tactile cues   Pertinent Vitals/Pain Pain Assessment Pain Assessment: 0-10 Pain Score: 2  Pain Location: R hip but didn't quantify Pain Descriptors / Indicators: Aching, Discomfort, Grimacing Pain Intervention(s): Limited activity within patient's tolerance, Monitored during session, Premedicated before session, Repositioned     PT Goals (current goals can now be found in the care plan section) Acute Rehab PT Goals Patient Stated Goal: to go home Progress towards PT goals: Progressing toward goals  Frequency    BID        AM-PAC PT 6 Clicks Mobility   Outcome Measure  Help needed turning from your back to your side while in a flat bed without using bedrails?: A Lot Help needed moving from lying on your back to sitting on the side of a flat bed without using bedrails?: A Lot Help needed moving to and from a bed to a chair (including a wheelchair)?: A Lot Help needed standing up from a chair using your arms (e.g., wheelchair or bedside chair)?: A Lot Help needed to walk in hospital room?: A Lot Help needed climbing 3-5 steps with a railing? : Total 6 Click Score: 11    End of Session Equipment Utilized During Treatment: Gait belt Activity Tolerance: Patient tolerated treatment well Patient left: in chair;with call bell/phone within reach;with chair alarm set Nurse Communication: Mobility status PT Visit Diagnosis: Difficulty in walking, not elsewhere classified (R26.2);Muscle weakness (generalized) (M62.81);Pain Pain - Right/Left: Right Pain - part of body: Hip     Time: 1201-1215 PT Time Calculation (min) (ACUTE ONLY): 14 min  Charges:    $Therapeutic Activity: 8-22 mins PT General Charges $$ ACUTE PT VISIT: 1 Visit                     Rankin Essex PTA 06/16/24, 1:31 PM

## 2024-06-16 NOTE — Plan of Care (Signed)
   Problem: Education: Goal: Knowledge of General Education information will improve Description: Including pain rating scale, medication(s)/side effects and non-pharmacologic comfort measures Outcome: Progressing   Problem: Activity: Goal: Risk for activity intolerance will decrease Outcome: Progressing   Problem: Elimination: Goal: Will not experience complications related to bowel motility Outcome: Progressing   Problem: Skin Integrity: Goal: Risk for impaired skin integrity will decrease Outcome: Progressing

## 2024-06-16 NOTE — Progress Notes (Signed)
 " PROGRESS NOTE    William Campbell  FMW:[id number] DOB: Sep 17, 1929 DOA: 06/13/2024 PCP: [facility]    Brief Narrative:   William Campbell is a 89 y.o. male William Campbell is a 89 y.o. male with medical history significant for COPD, CAD, hypothyroidism, peripheral neuropathy with gait imbalance, cervicogenic headache and orthostatic hypotension, previously followed by neurology, last seen 02/2023, who presented to the hospital because of pain in the right hip after a fall at home.  He tripped over a rug in the kitchen while he was cooking.  He was found to have closed right hip fracture.      Assessment & Plan:   Principal Problem:   Closed right hip fracture, initial encounter [facility]) Active Problems:   Peripheral neuropathy / gait imbalance   Anemia, unknown acuity   Coronary artery disease   COPD (chronic obstructive pulmonary disease) (HCC)   Hypothyroidism    Closed right hip fracture: S/p intramedullary nailing of right femur with cephalomedullary device on 06/14/2024. Plan: Pain control PT OT Bowel regimen VTE prophylaxis SNF workup   Acute on chronic postoperative anemia :  Hemoglobin down from 11.9-9.4.  No indication for blood transfusion at this time.  Monitor H&H and transfuse as needed. No evidence of blood loss     Comorbidities include peripheral neuropathy, CAD, hypothyroidism.      DVT prophylaxis: SQL Code Status: Full Family Communication: Son at bedside 2/4 Disposition Plan: Status is: Inpatient Remains inpatient appropriate because: A fracture status post operative repair   Level of care: Med-Surg  Consultants:  Orthopedics  Procedures:  Hip fracture repair  Antimicrobials: None   Subjective: Seen and examined.  Sitting up on edge of bed.  No visible distress.  Objective: Vitals:   06/16/24 0227 06/16/24 1016 06/16/24 1301 06/16/24 1447  BP: (!) 127/91 (!) 141/76 (!) 101/51 (!) 169/82  Pulse: (!) 51 60 80 (!) 57  Resp: 18  18  18   Temp: 98.6 F (37 C) 97.7 F (36.5 C)  98.1 F (36.7 C)  TempSrc:      SpO2: 95% 97%  99%  Weight:      Height:        Intake/Output Summary (Last 24 hours) at 06/16/2024 1538 Last data filed at 06/16/2024 1300 Gross per 24 hour  Intake 0 ml  Output 350 ml  Net -350 ml   Filed Weights   06/13/24 1947  Weight: 68 kg    Examination:  General exam: Appears calm and comfortable  Respiratory system: Clear to auscultation. Respiratory effort normal. Cardiovascular system: S1-S2, RRR, no murmurs, no pedal edema Gastrointestinal system: Soft, NT/ND, normal bowel sounds Central nervous system: Alert and oriented. No focal neurological deficits. Extremities: Right lower extremity surgical dressing CDI Skin: No rashes, lesions or ulcers Psychiatry: Judgement and insight appear normal. Mood & affect appropriate.     Data Reviewed: I have personally reviewed following labs and imaging studies  CBC: Recent Labs  Lab 06/13/24 2358 06/15/24 0437 06/16/24 0510  WBC 10.1 8.7 7.8  NEUTROABS 7.8*  --   --   HGB 11.9* 9.4* 9.9*  HCT 35.4* 29.2* 30.3*  MCV 87.8 91.8 89.6  PLT 230 182 188   Basic Metabolic Panel: Recent Labs  Lab 06/13/24 2358 06/15/24 0702 06/16/24 0510  NA 137 136 135  K 4.3 4.3 4.4  CL 101 103 102  CO2 26 24 24   GLUCOSE 115* 128* 92  BUN 22 24* 29*  CREATININE 1.01 1.09  1.20  CALCIUM 9.8 8.8* 9.1   GFR: Estimated Creatinine Clearance: 37.8 mL/min (by C-G formula based on SCr of 1.2 mg/dL). Liver Function Tests: No results for input(s): AST, ALT, ALKPHOS, BILITOT, PROT, ALBUMIN in the last 168 hours. No results for input(s): LIPASE, AMYLASE in the last 168 hours. No results for input(s): AMMONIA in the last 168 hours. Coagulation Profile: Recent Labs  Lab 06/13/24 2358  INR 1.1   Cardiac Enzymes: No results for input(s): CKTOTAL, CKMB, CKMBINDEX, TROPONINI in the last 168 hours. BNP (last 3 results) No results  for input(s): PROBNP in the last 8760 hours. HbA1C: No results for input(s): HGBA1C in the last 72 hours. CBG: No results for input(s): GLUCAP in the last 168 hours. Lipid Profile: No results for input(s): CHOL, HDL, LDLCALC, TRIG, CHOLHDL, LDLDIRECT in the last 72 hours. Thyroid  Function Tests: No results for input(s): TSH, T4TOTAL, FREET4, T3FREE, THYROIDAB in the last 72 hours. Anemia Panel: Recent Labs    06/13/24 2358  RETICCTPCT 1.3   Sepsis Labs: No results for input(s): PROCALCITON, LATICACIDVEN in the last 168 hours.  No results found for this or any previous visit (from the past 240 hours).       Radiology Studies: DG HIP UNILAT WITH PELVIS 2-3 VIEWS RIGHT Result Date: 06/14/2024 CLINICAL DATA:  461500 Elective surgery 461500 EXAM: DG HIP (WITH OR WITHOUT PELVIS) 2-3V RIGHT COMPARISON:  June 13, 2024 FINDINGS: Spot fluoroscopy images were obtained for surgical planning purposes. This demonstrates placement of an intramedullary rod within the RIGHT femur. Time: 40 seconds Dose: 7.8 mGy Please reference procedure report for further details. IMPRESSION: Spot fluoroscopy images obtained for surgical planning purposes. Electronically Signed   By: Corean Salter M.D.   On: 06/14/2024 17:01   DG C-Arm 1-60 Min-No Report Result Date: 06/14/2024 Fluoroscopy was utilized by the requesting physician.  No radiographic interpretation.        Scheduled Meds:  acetaminophen   1,000 mg Oral Q8H   cholecalciferol   5,000 Units Oral BID   cyanocobalamin   5,000 mcg Oral Q M,W,F   enoxaparin  (LOVENOX ) injection  40 mg Subcutaneous Q24H   famotidine   10 mg Oral QHS   senna  1 tablet Oral QHS   Continuous Infusions:  sodium chloride  75 mL/hr at 06/15/24 1537     LOS: 2 days       Calvin KATHEE Robson, MD [facility]   If 7PM-7AM, please contact night-coverage  06/16/2024, 3:38 PM   "

## 2024-06-17 LAB — CBC
HCT: 32.5 % — ABNORMAL LOW (ref 39.0–52.0)
Hemoglobin: 10.7 g/dL — ABNORMAL LOW (ref 13.0–17.0)
MCH: 29.8 pg (ref 26.0–34.0)
MCHC: 32.9 g/dL (ref 30.0–36.0)
MCV: 90.5 fL (ref 80.0–100.0)
Platelets: 212 10*3/uL (ref 150–400)
RBC: 3.59 MIL/uL — ABNORMAL LOW (ref 4.22–5.81)
RDW: 14.1 % (ref 11.5–15.5)
WBC: 7.2 10*3/uL (ref 4.0–10.5)
nRBC: 0 % (ref 0.0–0.2)

## 2024-06-17 LAB — BASIC METABOLIC PANEL WITH GFR
Anion gap: 9 (ref 5–15)
BUN: 25 mg/dL — ABNORMAL HIGH (ref 8–23)
CO2: 27 mmol/L (ref 22–32)
Calcium: 9.4 mg/dL (ref 8.9–10.3)
Chloride: 102 mmol/L (ref 98–111)
Creatinine, Ser: 0.91 mg/dL (ref 0.61–1.24)
GFR, Estimated: >60 mL/min
Glucose, Bld: 94 mg/dL (ref 70–99)
Potassium: 4 mmol/L (ref 3.5–5.1)
Sodium: 138 mmol/L (ref 135–145)

## 2024-06-17 MED ORDER — SENNOSIDES-DOCUSATE SODIUM 8.6-50 MG PO TABS
1.0000 | ORAL_TABLET | Freq: Two times a day (BID) | ORAL | Status: DC
  Administered 2024-06-17 – 2024-06-18 (×2): 1 via ORAL
  Filled 2024-06-17 (×3): qty 1

## 2024-06-17 MED ORDER — POLYETHYLENE GLYCOL 3350 17 G PO PACK
17.0000 g | PACK | Freq: Every day | ORAL | Status: DC
  Administered 2024-06-17 – 2024-06-18 (×2): 17 g via ORAL
  Filled 2024-06-17 (×2): qty 1

## 2024-06-17 MED ORDER — FLEET ENEMA RE ENEM: 1.0000 | ENEMA | Freq: Every day | RECTAL | Status: DC | PRN

## 2024-06-17 NOTE — Plan of Care (Signed)
  Problem: Activity: Goal: Risk for activity intolerance will decrease Outcome: Progressing   Problem: Nutrition: Goal: Adequate nutrition will be maintained Outcome: Progressing   Problem: Coping: Goal: Level of anxiety will decrease Outcome: Progressing   Problem: Pain Managment: Goal: General experience of comfort will improve and/or be controlled Outcome: Progressing

## 2024-06-17 NOTE — Progress Notes (Signed)
 " PROGRESS NOTE    William Campbell  FMW:[id number] DOB: [birth year] DOA: 06/13/2024 PCP: [facility]    Brief Narrative:   William Campbell is a 89 y.o. male [name] is a 89 y.o. male with medical history significant for COPD, CAD, hypothyroidism, peripheral neuropathy with gait imbalance, cervicogenic headache and orthostatic hypotension, previously followed by neurology, last seen 02/2023, who presented to the hospital because of pain in the right hip after a fall at home.  He tripped over a rug in the kitchen while he was cooking.  He was found to have closed right hip fracture.    Assessment & Plan:   Principal Problem:   Closed right hip fracture, initial encounter [facility]) Active Problems:   Peripheral neuropathy / gait imbalance   Anemia, unknown acuity   Coronary artery disease   COPD (chronic obstructive pulmonary disease) (HCC)   Hypothyroidism    Closed right hip fracture S/p intramedullary nailing of right femur with cephalomedullary device on 06/14/2024 Plan: Continue pain control PT OT Bowel regimen VTE prophylaxis SNF workup   Acute on chronic postoperative anemia  Hemoglobin down from 11.9-9.4.  No indication for blood transfusion at this time.  Monitor H&H and transfuse as needed. No evidence of blood loss     Comorbidities include peripheral neuropathy, CAD, hypothyroidism.      DVT prophylaxis: SQL Code Status: Full Family Communication: Son at bedside 2/4 Disposition Plan: Status is: Inpatient Remains inpatient appropriate because: A fracture status post operative repair   Level of care: Med-Surg  Consultants:  Orthopedics  Procedures:  Hip fracture repair  Antimicrobials: None   Subjective: Seen and examined.  Sitting up in chair.  Feels well overall.  Pain on ambulation.  Objective: Vitals:   06/16/24 1447 06/16/24 2143 06/17/24 0312 06/17/24 0812  BP: (!) 169/82 (!) 164/78 (!) 154/97 (!) 189/87  Pulse: (!) 57 (!) 59  68 (!) 58  Resp: 18 15 17 19   Temp: 98.1 F (36.7 C) 97.6 F (36.4 C) 97.9 F (36.6 C) 97.8 F (36.6 C)  TempSrc:  Oral Oral Oral  SpO2: 99% 100% 98% 100%  Weight:      Height:        Intake/Output Summary (Last 24 hours) at 06/17/2024 1305 Last data filed at 06/17/2024 0900 Gross per 24 hour  Intake 720 ml  Output 2050 ml  Net -1330 ml   Filed Weights   06/13/24 1947  Weight: 68 kg    Examination:  General exam: Appears fatigued Respiratory system: Lungs clear.  Normal work of breathing.  Room air Cardiovascular system: S1-S2, RRR, no murmurs, no pedal edema Gastrointestinal system: Soft, NT/ND, normal bowel sounds Central nervous system: Alert and oriented. No focal neurological deficits. Extremities: Right lower extremity surgical dressing CDI Skin: No rashes, lesions or ulcers Psychiatry: Judgement and insight appear normal. Mood & affect appropriate.     Data Reviewed: I have personally reviewed following labs and imaging studies  CBC: Recent Labs  Lab 06/13/24 2358 06/15/24 0437 06/16/24 0510 06/17/24 0500  WBC 10.1 8.7 7.8 7.2  NEUTROABS 7.8*  --   --   --   HGB 11.9* 9.4* 9.9* 10.7*  HCT 35.4* 29.2* 30.3* 32.5*  MCV 87.8 91.8 89.6 90.5  PLT 230 182 188 212   Basic Metabolic Panel: Recent Labs  Lab 06/13/24 2358 06/15/24 0702 06/16/24 0510 06/17/24 0500  NA 137 136 135 138  K 4.3 4.3 4.4 4.0  CL 101 103 102  102  CO2 26 24 24 27   GLUCOSE 115* 128* 92 94  BUN 22 24* 29* 25*  CREATININE 1.01 1.09 1.20 0.91  CALCIUM 9.8 8.8* 9.1 9.4   GFR: Estimated Creatinine Clearance: 49.8 mL/min (by C-G formula based on SCr of 0.91 mg/dL). Liver Function Tests: No results for input(s): AST, ALT, ALKPHOS, BILITOT, PROT, ALBUMIN in the last 168 hours. No results for input(s): LIPASE, AMYLASE in the last 168 hours. No results for input(s): AMMONIA in the last 168 hours. Coagulation Profile: Recent Labs  Lab 06/13/24 2358  INR 1.1    Cardiac Enzymes: No results for input(s): CKTOTAL, CKMB, CKMBINDEX, TROPONINI in the last 168 hours. BNP (last 3 results) No results for input(s): PROBNP in the last 8760 hours. HbA1C: No results for input(s): HGBA1C in the last 72 hours. CBG: No results for input(s): GLUCAP in the last 168 hours. Lipid Profile: No results for input(s): CHOL, HDL, LDLCALC, TRIG, CHOLHDL, LDLDIRECT in the last 72 hours. Thyroid  Function Tests: No results for input(s): TSH, T4TOTAL, FREET4, T3FREE, THYROIDAB in the last 72 hours. Anemia Panel: No results for input(s): VITAMINB12, FOLATE, FERRITIN, TIBC, IRON, RETICCTPCT in the last 72 hours.  Sepsis Labs: No results for input(s): PROCALCITON, LATICACIDVEN in the last 168 hours.  No results found for this or any previous visit (from the past 240 hours).       Radiology Studies: No results found.       Scheduled Meds:  acetaminophen   1,000 mg Oral Q8H   cholecalciferol   5,000 Units Oral BID   cyanocobalamin   5,000 mcg Oral Q M,W,F   enoxaparin  (LOVENOX ) injection  40 mg Subcutaneous Q24H   famotidine   10 mg Oral QHS   polyethylene glycol  17 g Oral Daily   senna-docusate  1 tablet Oral BID   Continuous Infusions:     LOS: 3 days       Calvin KATHEE Robson, MD [facility]   If 7PM-7AM, please contact night-coverage  06/17/2024, 1:05 PM   "

## 2024-06-17 NOTE — Progress Notes (Signed)
 Physical Therapy Treatment Patient Details Name: William Campbell MRN: [id number] DOB: 1929-10-07 Today's Date: 06/17/2024   History of Present Illness Pt is a 89 yo male s/p R IM. PMH of COPD, CAD, peripheral neuropathy, orthostatic hypotension.    PT Comments  Pt was seated in recliner upon arrival. He remains A and pleasant. Agreeable to session with focus on improving gait tolerance and safety. Pt demonstrated much improved abilities over past 24 hours. Less severe R knee buckling with improved gait tolerance. He was safely able to ambulate to RN desk and return with slow antalgic step to gait pattern. Overall, pt remains far from baseline abilities and will benefit from continued skilled PT at DC to maximize independence and safety with all ADLs    If plan is discharge home, recommend the following: A lot of help with walking and/or transfers;A lot of help with bathing/dressing/bathroom;Assistance with cooking/housework;Assistance with feeding;Direct supervision/assist for medications management     Equipment Recommendations  Other (comment) (Defer to next level of care)       Precautions / Restrictions Precautions Precautions: Fall Recall of Precautions/Restrictions: Intact Precaution/Restrictions Comments: WBAT to RLE Restrictions Weight Bearing Restrictions Per Provider Order: Yes RLE Weight Bearing Per Provider Order: Weight bearing as tolerated     Mobility  Bed Mobility Overal bed mobility: Needs Assistance Bed Mobility: Supine to Sit  Supine to sit: Used rails, Min assist, HOB elevated General bed mobility comments: Pt was in recliner pre/post session    Transfers Overall transfer level: Needs assistance Equipment used: Rolling walker (2 wheels) Transfers: Sit to/from Stand Sit to Stand: Contact guard assist, Min assist  General transfer comment: Min assist to stand from slightly elevated bed height    Ambulation/Gait Ambulation/Gait assistance: Min assist Gait  Distance (Feet): 40 Feet Assistive device: Rolling walker (2 wheels) Gait Pattern/deviations: Step-to pattern Gait velocity: decreased  General Gait Details: Pt demonstrated much improved gait abilities this afternoon. Ambulated to/from RN desk ~ 40 ft with step to pattern. Less R knee buckling overall. slow antalgic step to pattern.   Balance Overall balance assessment: Needs assistance Sitting-balance support: Bilateral upper extremity supported Sitting balance-Leahy Scale: Good     Standing balance support: Bilateral upper extremity supported, During functional activity, Reliant on assistive device for balance Standing balance-Leahy Scale: Poor Standing balance comment: remains at high risk of falls       Communication Communication Communication: No apparent difficulties  Cognition Arousal: Alert Behavior During Therapy: WFL for tasks assessed/performed   PT - Cognitive impairments: No apparent impairments   PT - Cognition Comments: Pt is A and O x 4 eager to improve so he can return home to care for his spouse. Following commands: Intact      Cueing Cueing Techniques: Verbal cues, Tactile cues     General Comments General comments (skin integrity, edema, etc.): Pt performed several exercises once repositioned in recliner. RLE LAQ, seated marching. add/abduction, ankle pumps  all 10 x      Pertinent Vitals/Pain Pain Assessment Pain Assessment: 0-10 Pain Score: 4  Pain Location: R thigh- lateral > than medially Pain Descriptors / Indicators: Aching, Discomfort, Grimacing Pain Intervention(s): Limited activity within patient's tolerance, Monitored during session, Premedicated before session, Repositioned     PT Goals (current goals can now be found in the care plan section) Acute Rehab PT Goals Patient Stated Goal: rehab then home Progress towards PT goals: Progressing toward goals    Frequency    BID  AM-PAC PT 6 Clicks Mobility   Outcome Measure   Help needed turning from your back to your side while in a flat bed without using bedrails?: A Lot Help needed moving from lying on your back to sitting on the side of a flat bed without using bedrails?: A Lot Help needed moving to and from a bed to a chair (including a wheelchair)?: A Lot Help needed standing up from a chair using your arms (e.g., wheelchair or bedside chair)?: A Lot Help needed to walk in hospital room?: A Lot Help needed climbing 3-5 steps with a railing? : A Lot 6 Click Score: 12    End of Session   Activity Tolerance: Patient tolerated treatment well;Patient limited by pain Patient left: in chair;with call bell/phone within reach;with chair alarm set Nurse Communication: Mobility status PT Visit Diagnosis: Difficulty in walking, not elsewhere classified (R26.2);Muscle weakness (generalized) (M62.81);Pain Pain - Right/Left: Right Pain - part of body: Hip     Time: 8677-8652 PT Time Calculation (min) (ACUTE ONLY): 25 min  Charges:    $Gait Training: 8-22 mins $Therapeutic Activity: 8-22 mins PT General Charges $$ ACUTE PT VISIT: 1 Visit                    Rankin Essex PTA 06/17/24, 2:34 PM

## 2024-06-17 NOTE — Progress Notes (Signed)
 Physical Therapy Treatment Patient Details Name: William Campbell MRN: [id number] DOB: 02-29-1930 Today's Date: 06/17/2024   History of Present Illness Pt is a 89 yo male s/p R IM. PMH of COPD, CAD, peripheral neuropathy, orthostatic hypotension.    PT Comments  Pt was long sitting in bed upon arrival. He greets chartered loss adjuster and remains cooperative throughout session. Overall remains limited by pain and RLE buckling during stance phase of gait. He was able to tolerate getting OOB and taking steps to recliner however requested to wait until afternoon/PM session to focus on improving gait abilities. Pt fully participated in exercises once in recliner. See exercises list below. Pt remains far from his baseline and will continue to benefit from skilled PT at DC to maximize his independence and safety with all ADLs.    If plan is discharge home, recommend the following: A lot of help with walking and/or transfers;A lot of help with bathing/dressing/bathroom;Assistance with cooking/housework;Assistance with feeding;Direct supervision/assist for medications management     Equipment Recommendations  Other (comment) (Defer to next level of care)       Precautions / Restrictions Precautions Precautions: Fall Recall of Precautions/Restrictions: Intact Precaution/Restrictions Comments: WBAT to RLE Restrictions Weight Bearing Restrictions Per Provider Order: Yes RLE Weight Bearing Per Provider Order: Weight bearing as tolerated     Mobility  Bed Mobility Overal bed mobility: Needs Assistance Bed Mobility: Supine to Sit  Supine to sit: Used rails, Min assist, HOB elevated  General bed mobility comments: Pt continues to require increased time to exit bed with heavy use of UEs and LLE to assist RLE OOB    Transfers Overall transfer level: Needs assistance Equipment used: Rolling walker (2 wheels) Transfers: Sit to/from Stand Sit to Stand: Min assist  General transfer comment: Min assist to stand from  slightly elevated bed height    Ambulation/Gait Ambulation/Gait assistance: Min assist, Mod assist Gait Distance (Feet): 3 Feet Assistive device: Rolling walker (2 wheels) Gait Pattern/deviations: Step-to pattern, Knees buckling (RLE buckling) Gait velocity: decreased  General Gait Details: Pt has poor wt acceptance on RLE when progressing LLE,. knee buckling during RLE stance phase    Balance Overall balance assessment: Needs assistance Sitting-balance support: Bilateral upper extremity supported Sitting balance-Leahy Scale: Good     Standing balance support: Bilateral upper extremity supported, During functional activity, Reliant on assistive device for balance Standing balance-Leahy Scale: Poor Standing balance comment: remains at high risk of falls    Communication Communication Communication: No apparent difficulties  Cognition Arousal: Alert Behavior During Therapy: WFL for tasks assessed/performed   PT - Cognitive impairments: No apparent impairments    PT - Cognition Comments: Pt is A and O x 4 eager to improve so he can return home to care for his spouse. Following commands: Intact      Cueing Cueing Techniques: Verbal cues, Tactile cues     General Comments General comments (skin integrity, edema, etc.): Pt performed several exercises once repositioned in recliner. RLE LAQ, seated marching. add/abduction, ankle pumps  all 10 x      Pertinent Vitals/Pain Pain Assessment Pain Assessment: 0-10 Pain Score: 4  Pain Location: R thigh- lateral > than medially Pain Descriptors / Indicators: Aching, Discomfort, Grimacing Pain Intervention(s): Limited activity within patient's tolerance, Monitored during session, Premedicated before session, Repositioned     PT Goals (current goals can now be found in the care plan section) Acute Rehab PT Goals Patient Stated Goal: get well so I can go home to retunr to taking care  of my wife Progress towards PT goals: Progressing  toward goals    Frequency    BID       AM-PAC PT 6 Clicks Mobility   Outcome Measure  Help needed turning from your back to your side while in a flat bed without using bedrails?: A Lot Help needed moving from lying on your back to sitting on the side of a flat bed without using bedrails?: A Lot Help needed moving to and from a bed to a chair (including a wheelchair)?: A Lot Help needed standing up from a chair using your arms (e.g., wheelchair or bedside chair)?: A Lot Help needed to walk in hospital room?: A Lot Help needed climbing 3-5 steps with a railing? : A Lot 6 Click Score: 12    End of Session   Activity Tolerance: Patient tolerated treatment well;Patient limited by pain Patient left: in chair;with call bell/phone within reach;with chair alarm set Nurse Communication: Mobility status PT Visit Diagnosis: Difficulty in walking, not elsewhere classified (R26.2);Muscle weakness (generalized) (M62.81);Pain Pain - Right/Left: Right Pain - part of body: Hip     Time: 9086-9066 PT Time Calculation (min) (ACUTE ONLY): 20 min  Charges:    $Therapeutic Activity: 8-22 mins PT General Charges $$ ACUTE PT VISIT: 1 Visit                    Rankin Essex PTA 06/17/24, 1:21 PM

## 2024-06-17 NOTE — Progress Notes (Signed)
" °  Subjective: 3 Days Post-Op Procedures (LRB): FIXATION, FRACTURE, INTERTROCHANTERIC, WITH INTRAMEDULLARY ROD (Right) Patient reports pain as mild.   Patient is well, and has had no acute complaints or problems Plan is to go to rehab after hospital stay. Negative for chest pain and shortness of breath Fever: no Gastrointestinal: Negative for nausea and vomiting  Objective: Vital signs in last 24 hours: Temp:  [97.6 F (36.4 C)-98.1 F (36.7 C)] 97.9 F (36.6 C) (02/05 0312) Pulse Rate:  [57-80] 68 (02/05 0312) Resp:  [15-18] 17 (02/05 0312) BP: (101-169)/(51-97) 154/97 (02/05 0312) SpO2:  [97 %-100 %] 98 % (02/05 0312)  Intake/Output from previous day:  Intake/Output Summary (Last 24 hours) at 06/17/2024 0748 Last data filed at 06/17/2024 0500 Gross per 24 hour  Intake 480 ml  Output 1900 ml  Net -1420 ml    Intake/Output this shift: No intake/output data recorded.  Labs: Recent Labs    06/15/24 0437 06/16/24 0510 06/17/24 0500  HGB 9.4* 9.9* 10.7*   Recent Labs    06/16/24 0510 06/17/24 0500  WBC 7.8 7.2  RBC 3.38* 3.59*  HCT 30.3* 32.5*  PLT 188 212   Recent Labs    06/16/24 0510 06/17/24 0500  NA 135 138  K 4.4 4.0  CL 102 102  CO2 24 27  BUN 29* 25*  CREATININE 1.20 0.91  GLUCOSE 92 94  CALCIUM 9.1 9.4   No results for input(s): LABPT, INR in the last 72 hours.    EXAM General - Patient is Alert and Oriented Extremity - Neurovascular intact Sensation intact distally Dorsiflexion/Plantar flexion intact Compartment soft Dressing/Incision - clean, mild drainage in the honeycomb.  New honeycomb dressing today. Motor Function - intact, moving foot and toes well on exam.  Ambulated 12 feet with physical therapy.  Past Medical History:  Diagnosis Date   BPH (benign prostatic hypertrophy)    Coronary artery disease    GERD (gastroesophageal reflux disease)    History of colonic polyps    Osteoarthritis of neck     Assessment/Plan: 3  Days Post-Op Procedures (LRB): FIXATION, FRACTURE, INTERTROCHANTERIC, WITH INTRAMEDULLARY ROD (Right) Principal Problem:   Closed right hip fracture, initial encounter [facility]) Active Problems:   Coronary artery disease   COPD (chronic obstructive pulmonary disease) (HCC)   Hypothyroidism   Peripheral neuropathy / gait imbalance   Anemia, unknown acuity  Estimated body mass index is 21.52 kg/m as calculated from the following:   Height as of this encounter: 5' 10 (1.778 m).   Weight as of this encounter: 68 kg. Advance diet Up with therapy  Acute blood loss anemia.  Status post hip fracture surgery.  Hemoglobin 10.7 from 11.9.  Stable.  Follow.  Discharge plan: Dressing change as needed.  No showering.  Follow-up at [facility] clinic orthopedics in 2 weeks for staple removal and x-rays of the right hip.  DVT Prophylaxis - Lovenox  and TED hose Weight-Bearing as tolerated to right leg  Krystal Doyne, PA-C Orthopaedic Surgery 06/17/2024, 7:48 AM  "

## 2024-06-17 NOTE — TOC Progression Note (Addendum)
 Transition of Care [facility]) - Progression Note    Patient Details  Name: William Campbell MRN: [id number] Date of Birth: 28-Feb-1930  Transition of Care [facility]) CM/SW Contact  Nathanael CHRISTELLA Ring, RN Phone Number: 06/17/2024, 2:22 PM  Clinical Narrative:     Patient and family choose [facility] for skilled nursing.  Bed offer accepted in the hub, [facility] can accept him tomorrow.  Patient thinks he would  be okay to transport via car to the facility, patient's son will bring him some clothes to the hospital and then can take him to [facility].                     Expected Discharge Plan and Services                                               Social Drivers of Health (SDOH) Interventions SDOH Screenings   Food Insecurity: No Food Insecurity (06/15/2024)  Housing: Low Risk (06/15/2024)  Transportation Needs: No Transportation Needs (06/15/2024)  Utilities: Not At Risk (06/15/2024)  Financial Resource Strain: Low Risk  (01/26/2024)   Received from [facility]  Social Connections: Socially Integrated (06/15/2024)  Tobacco Use: Low Risk (06/14/2024)    Readmission Risk Interventions     No data to display

## 2024-06-17 NOTE — Care Management Important Message (Signed)
 Important Message  Patient Details  Name: William Campbell MRN: [id number] Date of Birth: 08-14-1929   Important Message Given:  Yes - Medicare IM     Davionte Lusby 06/17/2024, 11:39 AM

## 2024-06-18 NOTE — Care Plan (Signed)
 IV removed Pt was changed into regular clothes. \ Son to transport  Attempted to call report x2

## 2024-06-18 NOTE — Progress Notes (Signed)
 Physical Therapy Treatment Patient Details Name: William Campbell MRN: [id number] DOB: 06/23/1929 Today's Date: 06/18/2024   History of Present Illness Pt is a 89 yo male s/p R IM. PMH of COPD, CAD, peripheral neuropathy, orthostatic hypotension.        If plan is discharge home, recommend the following: A lot of help with walking and/or transfers;A lot of help with bathing/dressing/bathroom;Assistance with cooking/housework;Assistance with feeding;Direct supervision/assist for medications management     Equipment Recommendations  Other (comment) (Defer to next level of care)       Precautions / Restrictions Precautions Precautions: Fall Recall of Precautions/Restrictions: Intact Precaution/Restrictions Comments: WBAT to RLE Restrictions Weight Bearing Restrictions Per Provider Order: Yes RLE Weight Bearing Per Provider Order: Weight bearing as tolerated     Mobility  Bed Mobility  General bed mobility comments: Pt was in recliner pre/post session    Transfers Overall transfer level: Needs assistance Equipment used: Rolling walker (2 wheels) Transfers: Sit to/from Stand Sit to Stand: Contact guard assist, Min assist     Ambulation/Gait Ambulation/Gait assistance: Min assist Gait Distance (Feet): 100 Feet Assistive device: Rolling walker (2 wheels) Gait Pattern/deviations: Step-to pattern Gait velocity: decreased  General Gait Details: Pt continues to progress gait abilities and safety but remains far form baseline    Balance Overall balance assessment: Needs assistance Sitting-balance support: Bilateral upper extremity supported Sitting balance-Leahy Scale: Good     Standing balance support: Bilateral upper extremity supported, During functional activity, Reliant on assistive device for balance Standing balance-Leahy Scale: Fair Standing balance comment: Improved balance but still at high risk of falls       Communication Communication Communication: No  apparent difficulties  Cognition Arousal: Alert Behavior During Therapy: WFL for tasks assessed/performed   PT - Cognitive impairments: No apparent impairments    PT - Cognition Comments: Pt is A and O x 4 eager to improve so he can return home to care for his spouse. Following commands: Intact      Cueing Cueing Techniques: Verbal cues, Tactile cues         Pertinent Vitals/Pain Pain Assessment Pain Location: R thigh- lateral > than medially Pain Descriptors / Indicators: Aching, Discomfort, Grimacing     PT Goals (current goals can now be found in the care plan section) Acute Rehab PT Goals Patient Stated Goal: rehab then home Progress towards PT goals: Progressing toward goals    Frequency    BID       AM-PAC PT 6 Clicks Mobility   Outcome Measure  Help needed turning from your back to your side while in a flat bed without using bedrails?: A Lot Help needed moving from lying on your back to sitting on the side of a flat bed without using bedrails?: A Lot Help needed moving to and from a bed to a chair (including a wheelchair)?: A Lot Help needed standing up from a chair using your arms (e.g., wheelchair or bedside chair)?: A Lot Help needed to walk in hospital room?: A Lot Help needed climbing 3-5 steps with a railing? : A Lot 6 Click Score: 12    End of Session   Activity Tolerance: Patient tolerated treatment well;Patient limited by pain Patient left: in chair;with call bell/phone within reach;with chair alarm set Nurse Communication: Mobility status PT Visit Diagnosis: Difficulty in walking, not elsewhere classified (R26.2);Muscle weakness (generalized) (M62.81);Pain Pain - Right/Left: Right Pain - part of body: Hip     Time: 1050-1108 PT Time Calculation (min) (ACUTE ONLY): 18  min  Charges:    $Gait Training: 8-22 mins PT General Charges $$ ACUTE PT VISIT: 1 Visit                     Rankin Essex PTA 06/18/24, 11:54 AM

## 2024-06-18 NOTE — Plan of Care (Signed)
" °  Problem: Elimination: Goal: Will not experience complications related to bowel motility Outcome: Progressing   Problem: Bowel/Gastric: Goal: Gastrointestinal status for postoperative course will improve Outcome: Progressing   Problem: Education: Goal: Knowledge of General Education information will improve Description: Including pain rating scale, medication(s)/side effects and non-pharmacologic comfort measures Outcome: Adequate for Discharge   Problem: Health Behavior/Discharge Planning: Goal: Ability to manage health-related needs will improve Outcome: Adequate for Discharge   Problem: Clinical Measurements: Goal: Will remain free from infection Outcome: Adequate for Discharge   Problem: Pain Managment: Goal: General experience of comfort will improve and/or be controlled Outcome: Adequate for Discharge   "

## 2024-06-18 NOTE — Discharge Summary (Signed)
 Physician Discharge Summary  William Campbell FMW:[id number] DOB: 1929/10/14 DOA: 06/13/2024  PCP: [facility]  Admit date: 06/13/2024 Discharge date: 06/18/2024  Admitted From: Home Disposition:  SNF  Recommendations for Outpatient Follow-up:  Follow up with PCP in 1-2 weeks Follow up orthopedics 2 weeks  Home Health:No  Equipment/Devices:None   Discharge Condition:Stable  CODE STATUS:FULL  Diet recommendation: Reg  Brief/Interim Summary:  William Campbell is a 89 y.o. male William Campbell is a 89 y.o. male with medical history significant for COPD, CAD, hypothyroidism, peripheral neuropathy with gait imbalance, cervicogenic headache and orthostatic hypotension, previously followed by neurology, last seen 02/2023, who presented to the hospital because of pain in the right hip after a fall at home.  He tripped over a rug in the kitchen while he was cooking.  He was found to have closed right hip fracture.       Discharge Diagnoses:  Principal Problem:   Closed right hip fracture, initial encounter [facility]) Active Problems:   Peripheral neuropathy / gait imbalance   Anemia, unknown acuity   Coronary artery disease   COPD (chronic obstructive pulmonary disease) (HCC)   Hypothyroidism     Closed right hip fracture S/p intramedullary nailing of right femur with cephalomedullary device on 06/14/2024 Plan: BM reported Pain controlled Stable for dc to SNF Outpatient ortho follow up 2 weeks SQ lovenox  DVT proph  Acute on chronic postoperative anemia  Hemoglobin down from 11.9-9.4.  No indication for blood transfusion at this time.  No evidence of blood loss     Comorbidities include peripheral neuropathy, CAD, hypothyroidism.     Discharge Instructions  Discharge Instructions     Increase activity slowly   Complete by: As directed    No wound care   Complete by: As directed       Allergies as of 06/18/2024   No Known Allergies      Medication List      TAKE these medications    albuterol 108 (90 Base) MCG/ACT inhaler Commonly known as: VENTOLIN HFA Inhale 2 puffs into the lungs every 6 (six) hours as needed for wheezing.   Cyanocobalamin  5000 MCG Tbdp Take 5,000 mcg by mouth as directed. Take as directed on Monday, Wednesday and Friday.   enoxaparin  40 MG/0.4ML injection Commonly known as: LOVENOX  Inject 0.4 mLs (40 mg total) into the skin daily.   famotidine  10 MG tablet Commonly known as: PEPCID  Take 10 mg by mouth daily.   oxyCODONE  5 MG immediate release tablet Commonly known as: Oxy IR/ROXICODONE  Take 0.5-1 tablets (2.5-5 mg total) by mouth every 4 (four) hours as needed for moderate pain (pain score 4-6) (pain score 4-6).   PRESERVISION AREDS 2 PO Take 1 capsule by mouth 2 (two) times daily with a meal.   traMADol  50 MG tablet Commonly known as: ULTRAM  Take 1 tablet (50 mg total) by mouth every 6 (six) hours as needed (moderate pain, other narcotics cause side effects).   Vitamin D3 50 MCG (2000 UT) capsule Take 5,000 Units by mouth 2 (two) times daily.        Contact information for follow-up providers     Verlinda Boas, PA-C Follow up in 2 week(s).   Specialty: Orthopedic Surgery Why: For staple removal and x-rays of the right hip Contact information: [address] [place] [address] [phone]              Contact information for after-discharge care  Destination     [facility] .   Service: Skilled Nursing Contact information: [address] [place]  [address] [phone]                    Allergies[1]  Consultations: Orthopedics    Procedures/Studies: DG HIP UNILAT WITH PELVIS 2-3 VIEWS RIGHT Result Date: 06/14/2024 CLINICAL DATA:  461500 Elective surgery 461500 EXAM: DG HIP (WITH OR WITHOUT PELVIS) 2-3V RIGHT COMPARISON:  June 13, 2024 FINDINGS: Spot fluoroscopy images were obtained for  surgical planning purposes. This demonstrates placement of an intramedullary rod within the RIGHT femur. Time: 40 seconds Dose: 7.8 mGy Please reference procedure report for further details. IMPRESSION: Spot fluoroscopy images obtained for surgical planning purposes. Electronically Signed   By: Corean Salter M.D.   On: 06/14/2024 17:01   DG C-Arm 1-60 Min-No Report Result Date: 06/14/2024 Fluoroscopy was utilized by the requesting physician.  No radiographic interpretation.   MR HIP RIGHT WO CONTRAST Result Date: 06/14/2024 EXAM: MRI of the right HIP WITHOUT contrast. 06/14/2024 02:57:43 AM TECHNIQUE: Multiplanar multisequence MRI of the right hip was performed without the administration of intravenous contrast. COMPARISON: CT scan 06/14/2024. CLINICAL HISTORY: Hip trauma, fracture suspected, X-ray performed. FINDINGS: BONE MARROW: Acute nondisplaced intertrochanteric fracture of the right proximal femur with additional nondisplaced Fielding I subtrochanteric component. No aggressive marrow replacing lesion. HIP JOINT: Mild degenerative arthropathy of both hips with associated spurring. No significant joint effusion. No subchondral cysts. LABRUM: No labral tear or paralabral cyst. BURSAE: Prominent trochanteric bursitis is present. No significant iliopsoas bursitis. SCIATIC NERVE: Unremarkable MRI appearance of the sciatic nerve. MUSCLES AND TENDONS: Substantial edema in the right quadratus femoris muscle which appears to be torn distally. Fluid signal surrounds the right quadratus femoris. Fluid signal also surrounds the proximal portion of the vastus intermedius. Degenerative spurring of the pubis with low level adjacent edema in the hip adductor musculature just below the level of the pubis bilaterally. No gluteal tendon tear. Intact hamstrings tendon origin. No significant muscle atrophy. SOFT TISSUES: No focal soft tissue mass. INTRAPELVIC CONTENTS: Encapsulated nodularity in the transition zone  compatible with benign prostatic hypertrophy. Mild degenerative bilateral sacroiliac joints arthropathy. Chronic subluxation and deformity at the lumbosacral junction as shown on CT scan of 01/06/2022. Limited images of the intrapelvic contents demonstrate no acute abnormality. IMPRESSION: 1. Acute nondisplaced intertrochanteric fracture of the right proximal femur with additional nondisplaced Fielding I subtrochanteric component. 2. Right quadratus femoris distal tear with associated edema, with edema about the proximal vastus intermedius and right trochanteric bursitis. 3. Mild degenerative arthropathy of both hips with associated spurring. 4. Degenerative spurring of the pubis with mild adjacent edema in the hip adductor musculature bilaterally. 5. Mild degenerative arthropathy of the bilateral sacroiliac joints. 6. Chronic subluxation and deformity at the lumbosacral junction. 7. Benign prostatic hypertrophy. Electronically signed by: Ryan Salvage MD 06/14/2024 09:02 AM EST RP Workstation: [id number]   CT Hip Right Wo Contrast Result Date: 06/14/2024 EXAM: CT OF THE RIGHT HIP WITHOUT IV CONTRAST 06/14/2024 12:18:20 AM TECHNIQUE: CT of the right hip was performed without the administration of intravenous contrast. Multiplanar reformatted images are provided for review. Automated exposure control, iterative reconstruction, and/or weight based adjustment of the mA/kV was utilized to reduce the radiation dose to as low as reasonably achievable. COMPARISON: Comparison is made with the same day x-rays. CLINICAL HISTORY: Hip trauma, fracture suspected, x-ray done. FINDINGS: BONES: Nondisplaced intertrochanteric fracture is redemonstrated. Healed fracture extends inferolaterally into  the subtrochanteric proximal femoral diaphysis. No aggressive appearing osseous abnormality or periostitis. SOFT TISSUE: No significant soft tissue edema or fluid collections. No soft tissue mass. JOINT: Degenerative arthritis about  the right hip. No osseous erosions. INTRAPELVIC CONTENTS: Limited images of the intrapelvic contents are unremarkable. IMPRESSION: 1. Nondisplaced intertrochanteric fracture with fracture line extending into the subtrochanteric femoral diaphysis laterally. Electronically signed by: Norman Gatlin MD 06/14/2024 12:29 AM EST RP Workstation: [id number]   CT Head Wo Contrast Result Date: 06/13/2024 EXAM: CT HEAD WITHOUT CONTRAST 06/13/2024 08:44:16 PM TECHNIQUE: CT of the head was performed without the administration of intravenous contrast. Automated exposure control, iterative reconstruction, and/or weight based adjustment of the mA/kV was utilized to reduce the radiation dose to as low as reasonably achievable. COMPARISON: 8 / 27 / 23. CLINICAL HISTORY: Fall. FINDINGS: BRAIN AND VENTRICLES: No acute hemorrhage. No evidence of acute infarct. No hydrocephalus. No extra-axial collection. No mass effect or midline shift. Proportional prominence of ventricles and sulci, consistent with diffuse cerebral parenchymal volume loss. Periventricular and subcortical white matter hypoattenuation, consistent with moderate chronic ischemic microvascular disease. Calcified atherosclerotic plaque within cavernous/supraclinoid internal carotid arteries. ORBITS: No acute abnormality. Bilateral lens replacement. SINUSES: No acute abnormality. SOFT TISSUES AND SKULL: No acute soft tissue abnormality. No skull fracture. IMPRESSION: 1. No acute intracranial abnormality. Electronically signed by: Norman Gatlin MD 06/13/2024 08:50 PM EST RP Workstation: [id number]   DG Knee Complete 4 Views Right Result Date: 06/13/2024 CLINICAL DATA:  Clemens, right leg pain EXAM: RIGHT KNEE - COMPLETE 4+ VIEW COMPARISON:  None Available. FINDINGS: Frontal, bilateral oblique, and lateral views of the right knee are obtained. No acute displaced fracture, subluxation, or dislocation. There is moderate medial and lateral compartmental osteoarthritis. No joint  effusion. Soft tissues are unremarkable. IMPRESSION: 1. Osteoarthritis.  No acute fracture. Electronically Signed   By: Ozell Daring M.D.   On: 06/13/2024 20:46   DG HIP UNILAT WITH PELVIS 2-3 VIEWS RIGHT Result Date: 06/13/2024 CLINICAL DATA:  Clemens, right leg and hip pain EXAM: DG HIP (WITH OR WITHOUT PELVIS) 2-3V RIGHT COMPARISON:  None Available. FINDINGS: Frontal view of the pelvis as well as frontal and cross-table lateral views of the right hip are obtained. The bones are osteopenic. There is a nondisplaced inter trochanteric right hip fracture. The fracture line appears to extend from the inferior margin of the greater trochanter toward the superior aspect of the lesser trochanter and femoral neck. No other acute bony abnormalities. Symmetrical bilateral hip osteoarthritis. Sacroiliac joints are normal. IMPRESSION: 1. Nondisplaced inter trochanteric right hip fracture as above. 2. Severe osteopenia. Electronically Signed   By: Ozell Daring M.D.   On: 06/13/2024 20:46      Subjective: Seen and examined on day of dc Stable, no distress Appropriate for transfer to SNF  Discharge Exam: Vitals:   06/18/24 0339 06/18/24 0826  BP: (!) 166/84 (!) 163/87  Pulse: 64 63  Resp: 16 19  Temp:  (!) 97.5 F (36.4 C)  SpO2: 98% 98%   Vitals:   06/17/24 1611 06/17/24 2049 06/18/24 0339 06/18/24 0826  BP: (!) 178/89 (!) 153/85 (!) 166/84 (!) 163/87  Pulse: 62 65 64 63  Resp: 19 17 16 19   Temp: 98.4 F (36.9 C) 98.2 F (36.8 C)  (!) 97.5 F (36.4 C)  TempSrc:  Oral    SpO2: 99% 95% 98% 98%  Weight:      Height:        General: Pt is alert, awake, not in acute  distress Cardiovascular: RRR, S1/S2 +, no rubs, no gallops Respiratory: CTA bilaterally, no wheezing, no rhonchi Abdominal: Soft, NT, ND, bowel sounds + Extremities: no edema, no cyanosis    The results of significant diagnostics from this hospitalization (including imaging, microbiology, ancillary and laboratory) are listed  below for reference.     Microbiology: No results found for this or any previous visit (from the past 240 hours).   Labs: BNP (last 3 results) No results for input(s): BNP in the last 8760 hours. Basic Metabolic Panel: Recent Labs  Lab 06/13/24 2358 06/15/24 0702 06/16/24 0510 06/17/24 0500  NA 137 136 135 138  K 4.3 4.3 4.4 4.0  CL 101 103 102 102  CO2 26 24 24 27   GLUCOSE 115* 128* 92 94  BUN 22 24* 29* 25*  CREATININE 1.01 1.09 1.20 0.91  CALCIUM 9.8 8.8* 9.1 9.4   Liver Function Tests: No results for input(s): AST, ALT, ALKPHOS, BILITOT, PROT, ALBUMIN in the last 168 hours. No results for input(s): LIPASE, AMYLASE in the last 168 hours. No results for input(s): AMMONIA in the last 168 hours. CBC: Recent Labs  Lab 06/13/24 2358 06/15/24 0437 06/16/24 0510 06/17/24 0500  WBC 10.1 8.7 7.8 7.2  NEUTROABS 7.8*  --   --   --   HGB 11.9* 9.4* 9.9* 10.7*  HCT 35.4* 29.2* 30.3* 32.5*  MCV 87.8 91.8 89.6 90.5  PLT 230 182 188 212   Cardiac Enzymes: No results for input(s): CKTOTAL, CKMB, CKMBINDEX, TROPONINI in the last 168 hours. BNP: Invalid input(s): POCBNP CBG: No results for input(s): GLUCAP in the last 168 hours. D-Dimer No results for input(s): DDIMER in the last 72 hours. Hgb A1c No results for input(s): HGBA1C in the last 72 hours. Lipid Profile No results for input(s): CHOL, HDL, LDLCALC, TRIG, CHOLHDL, LDLDIRECT in the last 72 hours. Thyroid  function studies No results for input(s): TSH, T4TOTAL, T3FREE, THYROIDAB in the last 72 hours.  Invalid input(s): FREET3 Anemia work up No results for input(s): VITAMINB12, FOLATE, FERRITIN, TIBC, IRON, RETICCTPCT in the last 72 hours. Urinalysis No results found for: COLORURINE, APPEARANCEUR, LABSPEC, PHURINE, GLUCOSEU, HGBUR, BILIRUBINUR, KETONESUR, PROTEINUR, UROBILINOGEN, NITRITE, LEUKOCYTESUR Sepsis Labs Recent  Labs  Lab 06/13/24 2358 06/15/24 0437 06/16/24 0510 06/17/24 0500  WBC 10.1 8.7 7.8 7.2   Microbiology No results found for this or any previous visit (from the past 240 hours).   Time coordinating discharge: 40 minutes   SIGNED:   Calvin KATHEE Robson, MD  [facility] 06/18/2024, 11:06 AM Pager   If 7PM-7AM, please contact night-coverage     [1] No Known Allergies

## 2024-06-18 NOTE — Care Plan (Signed)
 DC packet given to son, report given to RN at [facility].  Pt wheeled out to main entrance

## 2024-06-18 NOTE — TOC Transition Note (Signed)
 Transition of Care [facility]) - Discharge Note   Patient Details  Name: William Campbell MRN: [id number] Date of Birth: 1930/01/14  Transition of Care [facility]) CM/SW Contact:  Daved JONETTA Hamilton, RN Phone Number: 06/18/2024, 3:32 PM   Clinical Narrative:     Patient will DC to: [facility], Room 208 Anticipated DC date: 06/18/24 Family notified: Artist Arnold Transport by: Evan Cuevas  Per MD patient ready for DC to [facility]. RN, patient, patient's family, and facility notified of DC. Discharge Summary sent to facility. RN given number for report. DC packet on chart.    TOC signing off.   Final next level of care: Skilled Nursing Facility Barriers to Discharge: Barriers Resolved   Patient Goals and CMS Choice Patient states their goals for this hospitalization and ongoing recovery are:: get home to his wife          Discharge Placement   Existing PASRR number confirmed : 06/15/24 ([id number])            Patient to be transferred to facility by: Patient's son Artist Name of family member notified: Artist Arnold Patient and family notified of of transfer: 06/18/24  Discharge Plan and Services Additional resources added to the After Visit Summary for                                       Social Drivers of Health (SDOH) Interventions SDOH Screenings   Food Insecurity: No Food Insecurity (06/15/2024)  Housing: Low Risk (06/15/2024)  Transportation Needs: No Transportation Needs (06/15/2024)  Utilities: Not At Risk (06/15/2024)  Financial Resource Strain: Low Risk  (01/26/2024)   Received from [facility]  Social Connections: Socially Integrated (06/15/2024)  Tobacco Use: Low Risk (06/14/2024)     Readmission Risk Interventions     No data to display
